# Patient Record
Sex: Male | Born: 1990 | Race: White | Hispanic: No | Marital: Single | Smoking: Current every day smoker
Health system: Southern US, Community
[De-identification: ages and names within clinical notes are randomized; demographics above are authoritative.]

## PROBLEM LIST (undated history)

## (undated) DIAGNOSIS — J45909 Unspecified asthma, uncomplicated: Secondary | ICD-10-CM

## (undated) DIAGNOSIS — L0390 Cellulitis, unspecified: Secondary | ICD-10-CM

## (undated) DIAGNOSIS — D649 Anemia, unspecified: Secondary | ICD-10-CM

## (undated) DIAGNOSIS — K219 Gastro-esophageal reflux disease without esophagitis: Secondary | ICD-10-CM

## (undated) DIAGNOSIS — F909 Attention-deficit hyperactivity disorder, unspecified type: Secondary | ICD-10-CM

## (undated) DIAGNOSIS — Z720 Tobacco use: Secondary | ICD-10-CM

## (undated) DIAGNOSIS — D696 Thrombocytopenia, unspecified: Secondary | ICD-10-CM

---
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
## 2011-09-25 ENCOUNTER — Emergency Department (HOSPITAL_COMMUNITY): Payer: No Typology Code available for payment source

## 2011-09-25 ENCOUNTER — Emergency Department (HOSPITAL_COMMUNITY)
Admission: EM | Admit: 2011-09-25 | Discharge: 2011-09-26 | Disposition: A | Discharge status: Home / Self Care | Payer: No Typology Code available for payment source | Attending: Emergency Medicine | Admitting: Emergency Medicine

## 2011-09-25 ENCOUNTER — Encounter (HOSPITAL_COMMUNITY): Payer: Self-pay | Admitting: *Deleted

## 2011-09-25 ENCOUNTER — Encounter (HOSPITAL_COMMUNITY): Payer: Self-pay | Admitting: Emergency Medicine

## 2011-09-25 ENCOUNTER — Emergency Department (HOSPITAL_COMMUNITY)
Admission: EM | Admit: 2011-09-25 | Discharge: 2011-09-25 | Disposition: A | Discharge status: Home / Self Care | Payer: No Typology Code available for payment source | Attending: Emergency Medicine | Admitting: Emergency Medicine

## 2011-09-25 DIAGNOSIS — F909 Attention-deficit hyperactivity disorder, unspecified type: Secondary | ICD-10-CM | POA: Insufficient documentation

## 2011-09-25 DIAGNOSIS — IMO0002 Reserved for concepts with insufficient information to code with codable children: Secondary | ICD-10-CM | POA: Insufficient documentation

## 2011-09-25 DIAGNOSIS — S0100XA Unspecified open wound of scalp, initial encounter: Secondary | ICD-10-CM | POA: Insufficient documentation

## 2011-09-25 DIAGNOSIS — M25519 Pain in unspecified shoulder: Secondary | ICD-10-CM | POA: Insufficient documentation

## 2011-09-25 DIAGNOSIS — T07XXXA Unspecified multiple injuries, initial encounter: Secondary | ICD-10-CM | POA: Insufficient documentation

## 2011-09-25 DIAGNOSIS — M79609 Pain in unspecified limb: Secondary | ICD-10-CM | POA: Insufficient documentation

## 2011-09-25 DIAGNOSIS — M546 Pain in thoracic spine: Secondary | ICD-10-CM | POA: Insufficient documentation

## 2011-09-25 DIAGNOSIS — T148XXA Other injury of unspecified body region, initial encounter: Secondary | ICD-10-CM | POA: Insufficient documentation

## 2011-09-25 DIAGNOSIS — R300 Dysuria: Secondary | ICD-10-CM | POA: Insufficient documentation

## 2011-09-25 DIAGNOSIS — R51 Headache: Secondary | ICD-10-CM | POA: Insufficient documentation

## 2011-09-25 DIAGNOSIS — M545 Low back pain, unspecified: Secondary | ICD-10-CM | POA: Insufficient documentation

## 2011-09-25 DIAGNOSIS — S0180XA Unspecified open wound of other part of head, initial encounter: Secondary | ICD-10-CM | POA: Insufficient documentation

## 2011-09-25 DIAGNOSIS — Z79899 Other long term (current) drug therapy: Secondary | ICD-10-CM | POA: Insufficient documentation

## 2011-09-25 DIAGNOSIS — R079 Chest pain, unspecified: Secondary | ICD-10-CM | POA: Insufficient documentation

## 2011-09-25 DIAGNOSIS — R339 Retention of urine, unspecified: Secondary | ICD-10-CM | POA: Insufficient documentation

## 2011-09-25 DIAGNOSIS — S0101XA Laceration without foreign body of scalp, initial encounter: Secondary | ICD-10-CM

## 2011-09-25 DIAGNOSIS — R319 Hematuria, unspecified: Secondary | ICD-10-CM | POA: Insufficient documentation

## 2011-09-25 DIAGNOSIS — S46919A Strain of unspecified muscle, fascia and tendon at shoulder and upper arm level, unspecified arm, initial encounter: Secondary | ICD-10-CM

## 2011-09-25 DIAGNOSIS — D649 Anemia, unspecified: Secondary | ICD-10-CM

## 2011-09-25 DIAGNOSIS — F17200 Nicotine dependence, unspecified, uncomplicated: Secondary | ICD-10-CM | POA: Insufficient documentation

## 2011-09-25 DIAGNOSIS — D696 Thrombocytopenia, unspecified: Secondary | ICD-10-CM

## 2011-09-25 DIAGNOSIS — S0181XA Laceration without foreign body of other part of head, initial encounter: Secondary | ICD-10-CM

## 2011-09-25 DIAGNOSIS — R350 Frequency of micturition: Secondary | ICD-10-CM | POA: Insufficient documentation

## 2011-09-25 DIAGNOSIS — R3911 Hesitancy of micturition: Secondary | ICD-10-CM | POA: Insufficient documentation

## 2011-09-25 DIAGNOSIS — E860 Dehydration: Secondary | ICD-10-CM | POA: Insufficient documentation

## 2011-09-25 DIAGNOSIS — X58XXXA Exposure to other specified factors, initial encounter: Secondary | ICD-10-CM | POA: Insufficient documentation

## 2011-09-25 HISTORY — DX: Attention-deficit hyperactivity disorder, unspecified type: F90.9

## 2011-09-25 LAB — COMPREHENSIVE METABOLIC PANEL
ALT: 17 U/L (ref 0–53)
ALT: 18 U/L (ref 0–53)
AST: 28 U/L (ref 0–37)
AST: 33 U/L (ref 0–37)
Albumin: 3.5 g/dL (ref 3.5–5.2)
Albumin: 4.4 g/dL (ref 3.5–5.2)
Alkaline Phosphatase: 57 U/L (ref 39–117)
Alkaline Phosphatase: 73 U/L (ref 39–117)
BUN: 12 mg/dL (ref 6–23)
CO2: 21 mEq/L (ref 19–32)
CO2: 25 mEq/L (ref 19–32)
Calcium: 9.2 mg/dL (ref 8.4–10.5)
Chloride: 109 mEq/L (ref 96–112)
Chloride: 100 mEq/L (ref 96–112)
Creatinine, Ser: 0.86 mg/dL (ref 0.50–1.35)
Creatinine, Ser: 1.01 mg/dL (ref 0.50–1.35)
GFR calc Af Amer: >90 mL/min (ref >90)
GFR calc non Af Amer: >90 mL/min (ref >90)
GFR calc non Af Amer: >90 mL/min (ref >90)
Glucose, Bld: 94 mg/dL (ref 70–99)
Potassium: 3 mEq/L — ABNORMAL LOW (ref 3.5–5.1)
Potassium: 3.9 mEq/L (ref 3.5–5.1)
Sodium: 136 mEq/L (ref 135–145)
Total Bilirubin: 0.3 mg/dL (ref 0.3–1.2)
Total Bilirubin: 0.7 mg/dL (ref 0.3–1.2)
Total Protein: 7.2 g/dL (ref 6.0–8.3)

## 2011-09-25 LAB — URINALYSIS, MICROSCOPIC ONLY
Bilirubin Urine: NEGATIVE
Ketones, ur: 15 mg/dL — AB
Leukocytes, UA: NEGATIVE
Nitrite: NEGATIVE
Protein, ur: NEGATIVE mg/dL
Urobilinogen, UA: 0.2 mg/dL (ref 0.0–1.0)

## 2011-09-25 LAB — CBC
HCT: 24 % — ABNORMAL LOW (ref 39.0–52.0)
HCT: 41.8 % (ref 39.0–52.0)
Hemoglobin: 14.5 g/dL (ref 13.0–17.0)
MCH: 32.4 pg (ref 26.0–34.0)
MCH: 32.4 pg (ref 26.0–34.0)
MCHC: 32.5 g/dL (ref 30.0–36.0)
MCHC: 34.6 g/dL (ref 30.0–36.0)
MCHC: 34.7 g/dL (ref 30.0–36.0)
MCV: 99.6 fL (ref 78.0–100.0)
MCV: 93.8 fL (ref 78.0–100.0)
MCV: 93.5 fL (ref 78.0–100.0)
Platelets: 59 10*3/uL — ABNORMAL LOW (ref 150–400)
Platelets: 62 10*3/uL — ABNORMAL LOW (ref 150–400)
Platelets: 116 10*3/uL — ABNORMAL LOW (ref 150–400)
RBC: 2.5 MIL/uL — ABNORMAL LOW (ref 4.22–5.81)
RBC: 4.47 MIL/uL (ref 4.22–5.81)
RDW: 13.7 % (ref 11.5–15.5)
RDW: 12.7 % (ref 11.5–15.5)
RDW: 12.6 % (ref 11.5–15.5)
WBC: 8 10*3/uL (ref 4.0–10.5)
WBC: 10.2 10*3/uL (ref 4.0–10.5)

## 2011-09-25 LAB — DIFFERENTIAL
Basophils Absolute: 0 10*3/uL (ref 0.0–0.1)
Basophils Absolute: 0 10*3/uL (ref 0.0–0.1)
Basophils Relative: 0 % (ref 0–1)
Eosinophils Absolute: 0 10*3/uL (ref 0.0–0.7)
Eosinophils Absolute: 0.1 10*3/uL (ref 0.0–0.7)
Eosinophils Relative: 1 % (ref 0–5)
Lymphocytes Relative: 19 % (ref 12–46)
Lymphs Abs: 0.6 10*3/uL — ABNORMAL LOW (ref 0.7–4.0)
Lymphs Abs: 2 10*3/uL (ref 0.7–4.0)
Monocytes Absolute: 1.3 10*3/uL — ABNORMAL HIGH (ref 0.1–1.0)
Monocytes Relative: 13 % — ABNORMAL HIGH (ref 3–12)
Neutro Abs: 6.8 10*3/uL (ref 1.7–7.7)
Neutro Abs: 6.9 10*3/uL (ref 1.7–7.7)
Neutrophils Relative %: 67 % (ref 43–77)

## 2011-09-25 LAB — URINALYSIS, ROUTINE W REFLEX MICROSCOPIC
Bilirubin Urine: NEGATIVE
Ketones, ur: 15 mg/dL — AB
Nitrite: NEGATIVE
Urobilinogen, UA: 0.2 mg/dL (ref 0.0–1.0)
pH: 6.5 (ref 5.0–8.0)

## 2011-09-25 LAB — POCT I-STAT, CHEM 8
BUN: 8 mg/dL (ref 6–23)
Calcium, Ion: 0.89 mmol/L — ABNORMAL LOW (ref 1.12–1.32)
Creatinine, Ser: 1 mg/dL (ref 0.50–1.35)
Hemoglobin: 12.2 g/dL — ABNORMAL LOW (ref 13.0–17.0)
Sodium: 141 mEq/L (ref 135–145)
TCO2: 18 mmol/L (ref 0–100)

## 2011-09-25 LAB — PROTIME-INR
INR: 1.09 (ref 0.00–1.49)
Prothrombin Time: 14.3 seconds (ref 11.6–15.2)

## 2011-09-25 LAB — LACTIC ACID, PLASMA: Lactic Acid, Venous: 1.8 mmol/L (ref 0.5–2.2)

## 2011-09-25 LAB — URINE MICROSCOPIC-ADD ON

## 2011-09-25 LAB — SAMPLE TO BLOOD BANK

## 2011-09-25 MED ORDER — ONDANSETRON HCL 4 MG/2ML IJ SOLN: 4.0000 mg | Freq: Once | INTRAMUSCULAR | Status: DC

## 2011-09-25 MED ORDER — IOHEXOL 300 MG/ML  SOLN
100.0000 mL | Freq: Once | INTRAMUSCULAR | Status: AC | PRN
  Administered 2011-09-25: 100 mL via INTRAVENOUS

## 2011-09-25 MED ORDER — DIAZEPAM 5 MG PO TABS: 5.0000 mg | ORAL_TABLET | Freq: Three times a day (TID) | ORAL | Status: DC | PRN

## 2011-09-25 MED ORDER — ONDANSETRON 4 MG PO TBDP
8.0000 mg | ORAL_TABLET | Freq: Once | ORAL | Status: AC
  Administered 2011-09-25: 8 mg via ORAL
  Filled 2011-09-25: qty 2

## 2011-09-25 MED ORDER — FENTANYL CITRATE 0.05 MG/ML IJ SOLN
50.0000 ug | INTRAMUSCULAR | Status: DC | PRN
  Administered 2011-09-25 (×3): 50 ug via INTRAVENOUS
  Filled 2011-09-25 (×3): qty 2

## 2011-09-25 MED ORDER — CEPHALEXIN 500 MG PO CAPS: 500.0000 mg | ORAL_CAPSULE | Freq: Four times a day (QID) | ORAL | Status: DC

## 2011-09-25 MED ORDER — PHENAZOPYRIDINE HCL 200 MG PO TABS: 200.0000 mg | ORAL_TABLET | Freq: Three times a day (TID) | ORAL | Status: DC

## 2011-09-25 MED ORDER — TETANUS-DIPHTH-ACELL PERTUSSIS 5-2.5-18.5 LF-MCG/0.5 IM SUSP
0.5000 mL | Freq: Once | INTRAMUSCULAR | Status: AC
  Administered 2011-09-25: 0.5 mL via INTRAMUSCULAR
  Filled 2011-09-25: qty 0.5

## 2011-09-25 MED ORDER — LIDOCAINE HCL 2 % EX GEL: Freq: Once | CUTANEOUS | Status: DC

## 2011-09-25 MED ORDER — ONDANSETRON HCL 4 MG/2ML IJ SOLN
INTRAMUSCULAR | Status: AC
  Administered 2011-09-25: 4 mg via INTRAVENOUS
  Filled 2011-09-25: qty 2

## 2011-09-25 MED ORDER — PHENAZOPYRIDINE HCL 100 MG PO TABS
200.0000 mg | ORAL_TABLET | Freq: Once | ORAL | Status: AC
  Administered 2011-09-25: 200 mg via ORAL
  Filled 2011-09-25: qty 2

## 2011-09-25 MED ORDER — OXYCODONE-ACETAMINOPHEN 5-325 MG PO TABS
2.0000 | ORAL_TABLET | Freq: Once | ORAL | Status: AC
  Administered 2011-09-25: 2 via ORAL
  Filled 2011-09-25: qty 2

## 2011-09-25 MED ORDER — CEFAZOLIN SODIUM 1-5 GM-% IV SOLN
1.0000 g | Freq: Once | INTRAVENOUS | Status: AC
  Administered 2011-09-25: 1 g via INTRAVENOUS
  Filled 2011-09-25: qty 50

## 2011-09-25 MED ORDER — OXYCODONE-ACETAMINOPHEN 5-325 MG PO TABS: 2.0000 | ORAL_TABLET | ORAL | Status: DC | PRN

## 2011-09-25 MED ORDER — ONDANSETRON HCL 4 MG/2ML IJ SOLN
4.0000 mg | Freq: Once | INTRAMUSCULAR | Status: AC
  Administered 2011-09-25: 4 mg via INTRAVENOUS
  Filled 2011-09-25: qty 2

## 2011-09-25 MED ORDER — DIAZEPAM 5 MG PO TABS
5.0000 mg | ORAL_TABLET | Freq: Once | ORAL | Status: AC
  Administered 2011-09-25: 5 mg via ORAL
  Filled 2011-09-25: qty 1

## 2011-09-25 MED ORDER — HYDROMORPHONE HCL PF 1 MG/ML IJ SOLN
1.0000 mg | Freq: Once | INTRAMUSCULAR | Status: AC
  Administered 2011-09-25: 1 mg via INTRAMUSCULAR
  Filled 2011-09-25: qty 1
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
## 2011-09-25 NOTE — Progress Notes (Signed)
Orthopedic Tech Progress Note Patient Details:  Mathew Bailey 31-Jul-1990 [id number]  Other Ortho Devices Type of Ortho Device: Other (comment) Ortho Device Interventions: Application   Tareek Sabo T 09/25/2011, 8:36 AM
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
## 2011-09-25 NOTE — ED Notes (Signed)
Patient currently resting quietly in bed; no respiratory or acute distress noted.  Patient updated on plan of care; informed patient that we are currently waiting on further orders from FNP.  Patient has no other questions or concerns at this time; will continue to monitor.

## 2011-09-25 NOTE — ED Notes (Signed)
Potassium of 2.7, reported to Dr Norlene Campbell.

## 2011-09-25 NOTE — ED Notes (Signed)
Pain in jaw sutures in his scalp.  He has bloody urine headache

## 2011-09-25 NOTE — ED Notes (Signed)
Parents: Stasia Cavalier and Jermery Caratachea, [phone].  Patient's parents contacted per patient request.  Parents enroute to ED.

## 2011-09-25 NOTE — ED Notes (Signed)
Patient currently resting quietly in bed; no respiratory or acute distress noted.  Patient updated on plan of care; informed patient that we are currently waiting on urine results to come back.  Patient has no other questions or concerns at this time; will continue to monitor.

## 2011-09-25 NOTE — ED Notes (Signed)
FNP at bedside with bladder scanner.

## 2011-09-25 NOTE — ED Notes (Signed)
Patient with nausea.  MD aware.  New orders per Dr Norlene Campbell.

## 2011-09-25 NOTE — ED Notes (Signed)
MD at bedside. 

## 2011-09-25 NOTE — ED Notes (Signed)
Patient given discharge paperwork; went over discharge instructions with patient.  Patient instructed to follow up with Dr. Kevan Ny and planned next week, to follow up with the urologist, to continue taking previous prescriptions, and to take pyridium as directed.  Patient instructed to return to the ED for new, worsening, or concerning symptoms.

## 2011-09-25 NOTE — ED Notes (Addendum)
Family at in room 6 awaiting patient return from radiology.

## 2011-09-25 NOTE — ED Notes (Signed)
Family at bedside. 

## 2011-09-25 NOTE — ED Notes (Signed)
Dr.Otter aware of critical chem 8
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
## 2011-09-25 NOTE — ED Notes (Signed)
Patient ambulated well to the bathroom and back to room with no distress.

## 2011-09-25 NOTE — Discharge Instructions (Signed)
Please read over the instructions below. You were seen tonight for your painful and difficulty urinating since your catheterization last night. Your urine shows you do have blood in your urine and that you remain dehydrated. There is no infection. We did not place the catheter as you were able to empty your bladder here normally. If your symptoms return you will need to return to have the catheter placed. Continue the antibiotic and medication for pain as directed. Tajke the Pyridium as directed for painful urination. It is very important that you drink lots of liquids (preferrable water) over the next 24-48 hours. Follow up with Dr Kevan Ny as planned this week. You also need to arrange follow up with the urologist to assure that the hematuria has resolved. Return if symptoms worsen. Otherwise follow up as discussed.  Dehydration, Adult Dehydration means your body does not have as much fluid as it needs. Your kidneys, brain, and heart will not work properly without the right amount of fluids and salt.  HOME CARE  Ask your doctor how to replace body fluid losses (rehydrate).   Drink enough fluids to keep your pee (urine) clear or pale yellow.   Drink small amounts of fluids often if you feel sick to your stomach (nauseous) or throw up (vomit).   Eat like you normally do.   Avoid:   Foods or drinks high in sugar.   Bubbly (carbonated) drinks.   Juice.   Very hot or cold fluids.   Drinks with caffeine.   Fatty, greasy foods.   Alcohol.   Tobacco.   Eating too much.   Gelatin desserts.   Wash your hands to avoid spreading germs (bacteria, viruses).   Only take medicine as told by your doctor.   Keep all doctor visits as told.  GET HELP RIGHT AWAY IF:   You cannot drink something without throwing up.   You get worse even with treatment.   Your vomit has blood in it or looks greenish.   Your poop (stool) has blood in it or looks black and tarry.   You have not peed in 6 to  8 hours.   You pee a small amount of very dark pee.   You have a fever.   You pass out (faint).   You have belly (abdominal) pain that gets worse or stays in one spot (localizes).   You have a rash, stiff neck, or bad headache.   You get easily annoyed, sleepy, or are hard to wake up.   You feel weak, dizzy, or very thirsty.  MAKE SURE YOU:   Understand these instructions.   Will watch your condition.   Will get help right away if you are not doing well or get worse.  Document Released: 03/13/2009 Document Revised: 05/06/2011 Document Reviewed: 01/04/2011 [facility] Patient Information 2012 [place], [place].Hematuria, Adult Hematuria (blood in your urine) can be caused by a bladder infection (cystitis), kidney infection (pyelonephritis), prostate infection (prostatitis), or kidney stone. Infections will usually respond to antibiotics (medications which kill germs), and a kidney stone will usually pass through your urine without further treatment. If you were put on antibiotics, take all the medicine until gone. You may feel better in a few days, but take all of your medicine or the infection may not respond and become more difficult to treat. If antibiotics were not given, an infection did not cause the blood in the urine. A further work up to find out the reason may be needed. HOME CARE INSTRUCTIONS  Drink lots of fluid, 3 to 4 quarts a day. If you have been diagnosed with an infection, cranberry juice is especially recommended, in addition to large amounts of water.   Avoid caffeine, tea, and carbonated beverages, because they tend to irritate the bladder.   Avoid alcohol as it may irritate the prostate.   Only take over-the-counter or prescription medicines for pain, discomfort, or fever as directed by your caregiver.   If you have been diagnosed with a kidney stone follow your caregivers instructions regarding straining your urine to catch the stone.  TO PREVENT FURTHER  INFECTIONS:  Empty the bladder often. Avoid holding urine for long periods of time.   After a bowel movement, women should cleanse front to back. Use each tissue only once.   Empty the bladder before and after sexual intercourse if you are a male.   Return to your caregiver if you develop back pain, fever, nausea (feeling sick to your stomach), vomiting, or your symptoms (problems) are not better in 3 days. Return sooner if you are getting worse.  If you have been requested to return for further testing make sure to keep your appointments. If an infection is not the cause of blood in your urine, X-rays may be required. Your caregiver will discuss this with you. SEEK IMMEDIATE MEDICAL CARE IF:   You have a persistent fever over 102 F (38.9 C).   You develop severe vomiting and are unable to keep the medication down.   You develop severe back or abdominal pain despite taking your medications.   You begin passing a large amount of blood or clots in your urine.   You feel extremely weak or faint, or pass out.  MAKE SURE YOU:   Understand these instructions.   Will watch your condition.   Will get help right away if you are not doing well or get worse.  Document Released: 05/17/2005 Document Revised: 05/06/2011 Document Reviewed: 01/04/2008 [facility] Patient Information 2012 [place], [place].Muscle Strain A muscle strain, or pulled muscle, occurs when a muscle is over-stretched. A small number of muscle fibers may also be torn. This is especially common in athletes. This happens when a sudden violent force placed on a muscle pushes it past its capacity. Usually, recovery from a pulled muscle takes 1 to 2 weeks. But complete healing will take 5 to 6 weeks. There are millions of muscle fibers. Following injury, your body will usually return to normal quickly. HOME CARE INSTRUCTIONS   While awake, apply ice to the sore muscle for 15 to 20 minutes each hour for the first 2 days. Put ice in  a plastic bag and place a towel between the bag of ice and your skin.   Do not use the pulled muscle for several days. Do not use the muscle if you have pain.   You may wrap the injured area with an elastic bandage for comfort. Be careful not to bind it too tightly. This may interfere with blood circulation.   Only take over-the-counter or prescription medicines for pain, discomfort, or fever as directed by your caregiver. Do not use aspirin as this will increase bleeding (bruising) at injury site.   Warming up before exercise helps prevent muscle strains.  SEEK MEDICAL CARE IF:  There is increased pain or swelling in the affected area. MAKE SURE YOU:   Understand these instructions.   Will watch your condition.   Will get help right away if you are not doing well or get worse.  Document Released: 05/17/2005 Document Revised: 05/06/2011 Document Reviewed: 12/14/2006 [facility] Patient Information 2012 [place], [place].

## 2011-09-25 NOTE — ED Notes (Signed)
Lab called stating that lab values were very different that patient's last draw (this AM) and that it is unlikely that his values would change this much; Natalia Leatherwood, FNP notified.  FNP wants labs redrawn; labs reordered, per verbal order verified by read-back.

## 2011-09-25 NOTE — ED Notes (Signed)
FNP at bedside.

## 2011-09-26 NOTE — ED Provider Notes (Signed)
Medical screening examination/treatment/procedure(s) were performed by non-physician practitioner and as supervising physician I was immediately available for consultation/collaboration. Devoria Albe, MD, FACEP   Ward Givens, MD 09/26/11 0001

## 2011-09-27 ENCOUNTER — Encounter (HOSPITAL_BASED_OUTPATIENT_CLINIC_OR_DEPARTMENT_OTHER): Payer: Self-pay | Admitting: *Deleted

## 2011-09-27 ENCOUNTER — Emergency Department (HOSPITAL_BASED_OUTPATIENT_CLINIC_OR_DEPARTMENT_OTHER)
Admission: EM | Admit: 2011-09-27 | Discharge: 2011-09-27 | Disposition: A | Discharge status: Home / Self Care | Payer: No Typology Code available for payment source | Attending: Emergency Medicine | Admitting: Emergency Medicine

## 2011-09-27 DIAGNOSIS — F17200 Nicotine dependence, unspecified, uncomplicated: Secondary | ICD-10-CM | POA: Insufficient documentation

## 2011-09-27 DIAGNOSIS — R51 Headache: Secondary | ICD-10-CM | POA: Insufficient documentation

## 2011-09-27 DIAGNOSIS — M79643 Pain in unspecified hand: Secondary | ICD-10-CM

## 2011-09-27 DIAGNOSIS — R079 Chest pain, unspecified: Secondary | ICD-10-CM | POA: Insufficient documentation

## 2011-09-27 DIAGNOSIS — M79609 Pain in unspecified limb: Secondary | ICD-10-CM | POA: Insufficient documentation

## 2011-09-27 MED ORDER — HYDROCODONE-ACETAMINOPHEN 7.5-500 MG PO TABS: 1.0000 | ORAL_TABLET | Freq: Four times a day (QID) | ORAL | Status: AC | PRN

## 2011-09-27 MED ORDER — DIAZEPAM 5 MG PO TABS: 5.0000 mg | ORAL_TABLET | Freq: Two times a day (BID) | ORAL | Status: AC

## 2011-09-27 MED ORDER — KETOROLAC TROMETHAMINE 30 MG/ML IJ SOLN
30.0000 mg | Freq: Once | INTRAMUSCULAR | Status: AC
  Administered 2011-09-27: 30 mg via INTRAMUSCULAR
  Filled 2011-09-27 (×2): qty 1

## 2011-09-27 MED ORDER — MORPHINE SULFATE 4 MG/ML IJ SOLN
4.0000 mg | Freq: Once | INTRAMUSCULAR | Status: AC
  Administered 2011-09-27: 4 mg via INTRAMUSCULAR
  Filled 2011-09-27 (×2): qty 1

## 2011-09-27 NOTE — ED Notes (Signed)
Chart reviewed, care assumed
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
## 2012-11-07 ENCOUNTER — Encounter (HOSPITAL_COMMUNITY): Payer: Self-pay

## 2012-11-07 ENCOUNTER — Emergency Department (HOSPITAL_COMMUNITY)
Admission: EM | Admit: 2012-11-07 | Discharge: 2012-11-08 | Disposition: A | Discharge status: Home / Self Care | Payer: PRIVATE HEALTH INSURANCE | Attending: Emergency Medicine | Admitting: Emergency Medicine

## 2012-11-07 ENCOUNTER — Emergency Department (HOSPITAL_COMMUNITY): Payer: PRIVATE HEALTH INSURANCE

## 2012-11-07 DIAGNOSIS — R110 Nausea: Secondary | ICD-10-CM | POA: Insufficient documentation

## 2012-11-07 DIAGNOSIS — K5900 Constipation, unspecified: Secondary | ICD-10-CM | POA: Insufficient documentation

## 2012-11-07 DIAGNOSIS — Z862 Personal history of diseases of the blood and blood-forming organs and certain disorders involving the immune mechanism: Secondary | ICD-10-CM | POA: Insufficient documentation

## 2012-11-07 DIAGNOSIS — K219 Gastro-esophageal reflux disease without esophagitis: Secondary | ICD-10-CM | POA: Insufficient documentation

## 2012-11-07 DIAGNOSIS — R109 Unspecified abdominal pain: Secondary | ICD-10-CM

## 2012-11-07 DIAGNOSIS — R197 Diarrhea, unspecified: Secondary | ICD-10-CM | POA: Insufficient documentation

## 2012-11-07 DIAGNOSIS — F17200 Nicotine dependence, unspecified, uncomplicated: Secondary | ICD-10-CM | POA: Insufficient documentation

## 2012-11-07 DIAGNOSIS — R634 Abnormal weight loss: Secondary | ICD-10-CM | POA: Insufficient documentation

## 2012-11-07 DIAGNOSIS — Z8659 Personal history of other mental and behavioral disorders: Secondary | ICD-10-CM | POA: Insufficient documentation

## 2012-11-07 DIAGNOSIS — R1013 Epigastric pain: Secondary | ICD-10-CM | POA: Insufficient documentation

## 2012-11-07 HISTORY — DX: Anemia, unspecified: D64.9

## 2012-11-07 NOTE — ED Notes (Signed)
My stomach is bothering me, I can't eat any food without being nauseated per pt. I can't eat a full meal, burping up stuff per pt. Having a lot of acid reflux. I have lost a lot of weight in the past 3 weeks. Patient states that he was weighing 150, now weights 135.

## 2012-11-07 NOTE — ED Provider Notes (Signed)
History  This chart was scribed for [employer]. Colon Branch, MD by Greggory Stallion, ED Scribe. This patient was seen in room APA18/[facility]18 and the patient's care was started at 11:18 PM.  CSN: [id number]  Arrival date & time 11/07/12  2144    Chief Complaint  Patient presents with  . Abdominal Pain  . Nausea  . Gastrophageal Reflux    The history is provided by the patient. No language interpreter was used.    HPI Comments: Mathew Bailey is a 22 y.o. male with h/o anemia who presents to the Emergency Department complaining of gradually worsening, constant epigastric abdominal pain with associated constipation. Pt states he can't keep food down. He states he is only having diarrhea, no solid stool. He states he has lost about 15 pounds in 3 weeks. Pt denies fever, hematochezia, neck pain, sore throat, visual disturbance, CP, cough, SOB, urinary symptoms, back pain, HA, weakness, numbness and rash as associated symptoms.    Past Medical History  Diagnosis Date  . ADHD (attention deficit hyperactivity disorder)   . Anemia     History reviewed. No pertinent past surgical history.  No family history on file.  History  Substance Use Topics  . Smoking status: Current Every Day Smoker  . Smokeless tobacco: Not on file  . Alcohol Use: Yes      Review of Systems  A complete 10 system review of systems was obtained and all systems are negative except as noted in the HPI and PMH.   Allergies  Review of patient's allergies indicates no known allergies.  Home Medications  No current outpatient prescriptions on file.  BP 132/64  Pulse 83  Temp(Src) 97.8 F (36.6 C) (Oral)  Resp 16  Ht 6' (1.829 m)  Wt 135 lb 5 oz (61.377 kg)  BMI 18.35 kg/m2  SpO2 98%  Physical Exam  Nursing note and vitals reviewed. Constitutional: He is oriented to person, place, and time. He appears well-developed and well-nourished.  HENT:  Head: Normocephalic and atraumatic.  Eyes: Pupils are equal,  round, and reactive to light.  Neck: Normal range of motion. Neck supple.  Cardiovascular: Normal rate and regular rhythm.   Pulmonary/Chest: Effort normal and breath sounds normal. He has no wheezes. He has no rales.  Abdominal: Soft. Bowel sounds are normal.  Mild epigastric pain.  Musculoskeletal: Normal range of motion.  Neurological: He is alert and oriented to person, place, and time.  Skin: Skin is warm and dry.    ED Course  Procedures (including critical care time) Results for orders placed during the hospital encounter of 11/07/12  CBC WITH DIFFERENTIAL      Result Value Range   WBC 7.9  4.0 - 10.5 K/uL   RBC 4.37  4.22 - 5.81 MIL/uL   Hemoglobin 13.9  13.0 - 17.0 g/dL   HCT 59.5  63.8 - 75.6 %   MCV 90.2  78.0 - 100.0 fL   MCH 31.8  26.0 - 34.0 pg   MCHC 35.3  30.0 - 36.0 g/dL   RDW 43.3  29.5 - 18.8 %   Platelets    150 - 400 K/uL   Value: PLATELET CLUMPS NOTED ON SMEAR, COUNT APPEARS DECREASED   Neutrophils Relative % 46  43 - 77 %   Lymphocytes Relative 40  12 - 46 %   Monocytes Relative 9  3 - 12 %   Eosinophils Relative 4  0 - 5 %   Basophils Relative 1  0 -  1 %   Neutro Abs 3.6  1.7 - 7.7 K/uL   Lymphs Abs 3.2  0.7 - 4.0 K/uL   Monocytes Absolute 0.7  0.1 - 1.0 K/uL   Eosinophils Absolute 0.3  0.0 - 0.7 K/uL   Basophils Absolute 0.1  0.0 - 0.1 K/uL   WBC Morphology ATYPICAL LYMPHOCYTES     Smear Review       Value: PLATELET CLUMPS NOTED ON SMEAR, COUNT APPEARS DECREASED  URINALYSIS, ROUTINE W REFLEX MICROSCOPIC      Result Value Range   Color, Urine YELLOW  YELLOW   APPearance CLEAR  CLEAR   Specific Gravity, Urine >1.030 (*) 1.005 - 1.030   pH 6.0  5.0 - 8.0   Glucose, UA NEGATIVE  NEGATIVE mg/dL   Hgb urine dipstick NEGATIVE  NEGATIVE   Bilirubin Urine SMALL (*) NEGATIVE   Ketones, ur TRACE (*) NEGATIVE mg/dL   Protein, ur NEGATIVE  NEGATIVE mg/dL   Urobilinogen, UA 0.2  0.0 - 1.0 mg/dL   Nitrite NEGATIVE  NEGATIVE   Leukocytes, UA NEGATIVE   NEGATIVE  Dg Abd Acute W/chest  11/08/2012   *RADIOLOGY REPORT*  Clinical Data: Epigastric abdominal pain, nausea, and constipation for 2 months.  Weight loss of 15 pounds over 3 weeks.  ACUTE ABDOMEN SERIES (ABDOMEN 2 VIEW & CHEST 1 VIEW)  Comparison: CT abdomen and pelvis 09/25/2011.  Chest 09/25/2011.  Findings: Normal heart size and pulmonary vascularity.  Mild hyperinflation.  No focal airspace consolidation in the lungs.  No blunting of costophrenic angles.  No pneumothorax.  Mediastinal contours appear intact.  Scattered gas and stool in the colon.  A few gas filled nondistended small bowel loops are present.  No small or large bowel distension.  No free intra-abdominal air.  No abnormal air fluid levels.  No radiopaque stones.  Visualized bones appear intact.  IMPRESSION: No evidence of active pulmonary disease.  Nonobstructive bowel gas pattern.   Original Report Authenticated By: Burman Nieves, M.D.   DIAGNOSTIC STUDIES: Oxygen Saturation is 98% on RA, normal by my interpretation.    COORDINATION OF CARE: 11:36 PM-Discussed treatment plan with pt at bedside and pt agreed to plan.      MDM  Patient with abdominal pain, recent weight loss and inability to eat without abdominal pain. Labs are unremarkable, xray is negative. Reviewed results with patient. Referral to GI made. Pt stable in ED with no significant deterioration in condition.The patient appears reasonably screened and/or stabilized for discharge and I doubt any other medical condition or other [facility] requiring further screening, evaluation, or treatment in the ED at this time prior to discharge.  I personally performed the services described in this documentation, which was scribed in my presence. The recorded information has been reviewed and considered.  MDM Reviewed: nursing note and vitals Interpretation: labs and x-ray         Nicoletta Dress. Colon Branch, MD 11/08/12 [phone]

## 2012-11-08 ENCOUNTER — Encounter: Payer: Self-pay | Admitting: Gastroenterology

## 2012-11-08 LAB — CBC WITH DIFFERENTIAL/PLATELET
Basophils Relative: 1 % (ref 0–1)
Eosinophils Absolute: 0.3 10*3/uL (ref 0.0–0.7)
Eosinophils Relative: 4 % (ref 0–5)
Hemoglobin: 13.9 g/dL (ref 13.0–17.0)
Lymphs Abs: 3.2 10*3/uL (ref 0.7–4.0)
MCH: 31.8 pg (ref 26.0–34.0)
MCHC: 35.3 g/dL (ref 30.0–36.0)
MCV: 90.2 fL (ref 78.0–100.0)
Monocytes Absolute: 0.7 10*3/uL (ref 0.1–1.0)
Neutro Abs: 3.6 10*3/uL (ref 1.7–7.7)
RBC: 4.37 MIL/uL (ref 4.22–5.81)

## 2012-11-08 LAB — BASIC METABOLIC PANEL
BUN: 14 mg/dL (ref 6–23)
CO2: 28 mEq/L (ref 19–32)
Calcium: 9.7 mg/dL (ref 8.4–10.5)
Chloride: 99 mEq/L (ref 96–112)
Creatinine, Ser: 1.11 mg/dL (ref 0.50–1.35)
Glucose, Bld: 82 mg/dL (ref 70–99)

## 2012-11-08 LAB — URINALYSIS, ROUTINE W REFLEX MICROSCOPIC
Glucose, UA: NEGATIVE mg/dL
Hgb urine dipstick: NEGATIVE
Leukocytes, UA: NEGATIVE
pH: 6 (ref 5.0–8.0)

## 2012-11-08 NOTE — ED Notes (Signed)
Pt alert & oriented x4, stable gait. Patient given discharge instructions, paperwork & prescription(s). Patient  instructed to stop at the registration desk to finish any additional paperwork. Patient verbalized understanding. Pt left department w/ no further questions. 

## 2012-11-17 ENCOUNTER — Other Ambulatory Visit: Payer: Self-pay | Admitting: Gastroenterology

## 2012-11-17 DIAGNOSIS — R109 Unspecified abdominal pain: Secondary | ICD-10-CM

## 2012-11-17 DIAGNOSIS — R634 Abnormal weight loss: Secondary | ICD-10-CM

## 2012-11-20 ENCOUNTER — Ambulatory Visit: Payer: PRIVATE HEALTH INSURANCE | Admitting: Gastroenterology

## 2012-11-21 ENCOUNTER — Ambulatory Visit
Admission: RE | Admit: 2012-11-21 | Discharge: 2012-11-21 | Disposition: A | Discharge status: Home / Self Care | Payer: PRIVATE HEALTH INSURANCE | Source: Ambulatory Visit | Attending: Gastroenterology | Admitting: Gastroenterology

## 2012-11-21 ENCOUNTER — Telehealth: Payer: Self-pay | Admitting: Gastroenterology

## 2012-11-21 DIAGNOSIS — R634 Abnormal weight loss: Secondary | ICD-10-CM

## 2012-11-21 DIAGNOSIS — R109 Unspecified abdominal pain: Secondary | ICD-10-CM

## 2012-11-21 NOTE — Telephone Encounter (Signed)
Message copied by Arna Snipe on Tue Nov 21, 2012 10:28 AM ------      Message from: Donata Duff      Created: Mon Nov 20, 2012  2:10 PM       Do not bill ------

## 2012-11-29 ENCOUNTER — Ambulatory Visit
Admission: RE | Admit: 2012-11-29 | Discharge: 2012-11-29 | Disposition: A | Discharge status: Home / Self Care | Payer: PRIVATE HEALTH INSURANCE | Source: Ambulatory Visit | Attending: Gastroenterology | Admitting: Gastroenterology

## 2012-11-29 MED ORDER — IOHEXOL 300 MG/ML  SOLN
100.0000 mL | Freq: Once | INTRAMUSCULAR | Status: AC | PRN
  Administered 2012-11-29: 100 mL via INTRAVENOUS

## 2012-12-26 ENCOUNTER — Telehealth: Payer: Self-pay | Admitting: Internal Medicine

## 2012-12-26 NOTE — Telephone Encounter (Signed)
C/D 12/26/12 for appt. 01/22/13

## 2012-12-26 NOTE — Telephone Encounter (Signed)
S/W PT MOTHER IN RE NP APPT 08/25 @ 1:30 W/DR. MOHAMED REFERRING DR. Jonny Ruiz GRIFFIN DX- THROMBOCYTOPENIA WELCOME PACKET MAILED.

## 2013-01-22 ENCOUNTER — Other Ambulatory Visit: Payer: PRIVATE HEALTH INSURANCE | Admitting: Lab

## 2013-01-22 ENCOUNTER — Ambulatory Visit: Payer: PRIVATE HEALTH INSURANCE

## 2013-01-22 ENCOUNTER — Encounter: Payer: Self-pay | Admitting: Internal Medicine

## 2013-01-22 ENCOUNTER — Ambulatory Visit (HOSPITAL_BASED_OUTPATIENT_CLINIC_OR_DEPARTMENT_OTHER): Payer: PRIVATE HEALTH INSURANCE | Admitting: Internal Medicine

## 2013-01-22 ENCOUNTER — Other Ambulatory Visit (HOSPITAL_BASED_OUTPATIENT_CLINIC_OR_DEPARTMENT_OTHER): Payer: PRIVATE HEALTH INSURANCE

## 2013-01-22 ENCOUNTER — Ambulatory Visit: Payer: PRIVATE HEALTH INSURANCE | Admitting: Internal Medicine

## 2013-01-22 ENCOUNTER — Ambulatory Visit (HOSPITAL_BASED_OUTPATIENT_CLINIC_OR_DEPARTMENT_OTHER): Payer: PRIVATE HEALTH INSURANCE

## 2013-01-22 VITALS — BP 140/76 | HR 60 | Temp 98.2°F | Resp 20 | Ht 72.0 in | Wt 138.6 lb

## 2013-01-22 DIAGNOSIS — R634 Abnormal weight loss: Secondary | ICD-10-CM

## 2013-01-22 DIAGNOSIS — D696 Thrombocytopenia, unspecified: Secondary | ICD-10-CM

## 2013-01-22 LAB — COMPREHENSIVE METABOLIC PANEL (CC13)
ALT: 10 U/L (ref 0–55)
AST: 13 U/L (ref 5–34)
Albumin: 4.3 g/dL (ref 3.5–5.0)
Alkaline Phosphatase: 65 U/L (ref 40–150)
BUN: 13.2 mg/dL (ref 7.0–26.0)
CO2: 26 meq/L (ref 22–29)
Calcium: 9.9 mg/dL (ref 8.4–10.4)
Chloride: 107 meq/L (ref 98–109)
Creatinine: 1 mg/dL (ref 0.7–1.3)
Glucose: 82 mg/dL (ref 70–140)
Potassium: 4.4 meq/L (ref 3.5–5.1)
Sodium: 143 meq/L (ref 136–145)
Total Bilirubin: 0.64 mg/dL (ref 0.20–1.20)
Total Protein: 7.5 g/dL (ref 6.4–8.3)

## 2013-01-22 LAB — CBC WITH DIFFERENTIAL/PLATELET
Basophils Absolute: 0.1 10*3/uL (ref 0.0–0.1)
EOS%: 4.4 % (ref 0.0–7.0)
HCT: 44.4 % (ref 38.4–49.9)
HGB: 14.8 g/dL (ref 13.0–17.1)
LYMPH%: 24.6 % (ref 14.0–49.0)
MCH: 31.1 pg (ref 27.2–33.4)
MONO#: 0.3 10*3/uL (ref 0.1–0.9)
NEUT%: 63.6 % (ref 39.0–75.0)
Platelets: 122 10*3/uL — ABNORMAL LOW (ref 140–400)
lymph#: 1.3 10*3/uL (ref 0.9–3.3)

## 2013-01-22 NOTE — Progress Notes (Signed)
[facility] [phone]   [phone]  CONSULT NOTE  REFERRING PHYSICIAN: Dr. Kirby Funk  REASON FOR CONSULTATION:  22 years old white male with thrombocytopenia  HPI Mathew Bailey is a 22 y.o. male with no significant past medical history except GERD and abdominal pain. The patient has been complaining since November of 2013 with frequent episodes of thickness in his stomach especially after eating as well as weight loss. His pain is usually Center in location and started early in the morning associated with some nausea and occasional vomiting. He has normal bowel movements. He lost around 30 pounds over the last 6 months. The patient has a history of alcohol abuse but quit in June of 2013. He also has some shortness of breath with exertion.  He was seen by Dr. Randa Evens on 12/11/2012 and upper endoscopy performed at that time was completely normal. Biopsies for celiac disease were negative. The patient also has CT entrography of the abdomen and pelvis that showed no signs of Crohn's disease and no acute findings. Scattered foci of gas/debris within the distal small bowel along with small volume pelvic ascites. While nonspecific, the  appearance and clinical history suggests an infectious/inflammatory enteritis. During his evaluation he was found to have few platelets count and he was referred to me today for further evaluation of his condition. The patient is a current smoker and he smoked marijuana in the past.  He denied having any significant complaints today. Dr. Randa Evens kindly referred the patient to me today for further evaluation and recommendation regarding his thrombocytopenia. He is known low platelets for more than a year. The patient has normal white blood count as well as normal hemoglobin and hematocrit. CBC on 09/25/2014 showed platelets count to 116,000. Repeat CBC on 11/07/2012 showed platelets clumps. CBC on 12/11/2012 showed platelets  count of 102,000. The patient denied having any bleeding issues, bruises or ecchymosis and no history of petechiae. @SFHPI @  Past Medical History  Diagnosis Date  . ADHD (attention deficit hyperactivity disorder)   . Anemia     History reviewed. No pertinent past surgical history.  History reviewed. No pertinent family history.  Social History History  Substance Use Topics  . Smoking status: Current Every Day Smoker  . Smokeless tobacco: Not on file  . Alcohol Use: Yes    No Known Allergies  Current Outpatient Prescriptions  Medication Sig Dispense Refill  . famotidine (PEPCID) 10 MG tablet Take 10 mg by mouth 2 (two) times daily.      . Probiotic Product (PHILLIPS COLON HEALTH PO) Take by mouth daily.       No current facility-administered medications for this visit.    Review of Systems  A comprehensive review of systems was negative.  Physical Exam  AVW:UJWJX, healthy, no distress, well nourished and well developed SKIN: skin color, texture, turgor are normal HEAD: Normocephalic, No masses, lesions, tenderness or abnormalities EYES: normal, PERRLA EARS: External ears normal OROPHARYNX:no exudate and no erythema  NECK: supple, no adenopathy LYMPH:  no palpable lymphadenopathy, no hepatosplenomegaly LUNGS: clear to auscultation  HEART: regular rate & rhythm and no murmurs ABDOMEN:abdomen soft, non-tender, normal bowel sounds and no masses or organomegaly BACK: Back symmetric, no curvature. EXTREMITIES:no joint deformities, effusion, or inflammation  NEURO: alert & oriented x 3 with fluent speech, no focal motor/sensory deficits  PERFORMANCE STATUS: ECOG 1  LABORATORY DATA: Lab Results  Component Value Date   WBC 5.3 01/22/2013   HGB 14.8 01/22/2013  HCT 44.4 01/22/2013   MCV 93.4 01/22/2013   PLT 122 Large platelets present* 01/22/2013      Chemistry      Component Value Date/Time   NA 143 01/22/2013 1614   NA 138 11/07/2012 2345   K 4.4 01/22/2013 1614     K 3.7 11/07/2012 2345   CL 99 11/07/2012 2345   CO2 26 01/22/2013 1614   CO2 28 11/07/2012 2345   BUN 13.2 01/22/2013 1614   BUN 14 11/07/2012 2345   CREATININE 1.0 01/22/2013 1614   CREATININE 1.11 11/07/2012 2345      Component Value Date/Time   CALCIUM 9.9 01/22/2013 1614   CALCIUM 9.7 11/07/2012 2345   ALKPHOS 65 01/22/2013 1614   ALKPHOS 73 09/25/2011 2123   AST 13 01/22/2013 1614   AST 33 09/25/2011 2123   ALT 10 01/22/2013 1614   ALT 18 09/25/2011 2123   BILITOT 0.64 01/22/2013 1614   BILITOT 0.7 09/25/2011 2123       RADIOGRAPHIC STUDIES: No results found.  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 His platelets count today whether 122,000 with a large platelets. My concern that the patient may have an immune mediated condition. I ordered several studies today to evaluate his condition including repeat CBC, comprehensive metabolic panel, LDH in addition to a TSH, acute hepatitis panel, HIV and ANA. I will see the patient back for followup visit in 2 weeks for evaluation and discussion of the pending lab results. The patient is feeling fine today and no need for intervention regarding treatment unless his platelets count are less than 50,000 or the patient is actively bleeding.  His persistent weight loss has no known etiology. He is followed by gastroenterology I would consider her for colonoscopy in the future to rule out Crohn's disease or ulcerative colitis. He will discuss this with Dr. Randa Evens in his upcoming visit. I gave the patient and his family the time to ask questions and I answered them completely to their satisfaction.  The patient voices understanding of current disease status and treatment options and is in agreement with the current care  plan.  All questions were answered. The patient knows to call the clinic with any problems, questions or concerns. We can certainly see the patient much sooner if necessary.  Thank you so much for allowing me to participate in the care of [name]. I will continue to follow up the patient with you and assist in his care.  I spent 35 minutes counseling the patient face to face. The total time spent in the appointment was 50 minutes.  Lambert Jeanty K. 01/22/2013, 6:48 PM

## 2013-01-22 NOTE — Patient Instructions (Signed)
Followup visit in 2 weeks. 

## 2013-01-22 NOTE — Progress Notes (Signed)
Checked in new pt with no financial concerns. °

## 2013-01-24 ENCOUNTER — Telehealth: Payer: Self-pay | Admitting: Internal Medicine

## 2013-01-24 ENCOUNTER — Other Ambulatory Visit: Payer: PRIVATE HEALTH INSURANCE | Admitting: Lab

## 2013-01-24 NOTE — Telephone Encounter (Signed)
lmonvm for pt re appt for lb today 8/27 and f/u 9/12. Pt asked to call me directly re new lb appt d/t if he cannot come in today. Schedule mailed.

## 2013-02-06 ENCOUNTER — Ambulatory Visit: Payer: PRIVATE HEALTH INSURANCE | Admitting: Internal Medicine

## 2013-02-09 ENCOUNTER — Telehealth: Payer: Self-pay | Admitting: Medical Oncology

## 2013-02-09 ENCOUNTER — Ambulatory Visit: Payer: PRIVATE HEALTH INSURANCE | Admitting: Internal Medicine

## 2013-02-09 NOTE — Telephone Encounter (Signed)
I left message for pt to call about missed appt.

## 2014-02-12 ENCOUNTER — Encounter (HOSPITAL_COMMUNITY): Payer: Self-pay | Admitting: Emergency Medicine

## 2014-02-12 ENCOUNTER — Inpatient Hospital Stay (HOSPITAL_COMMUNITY)
Admission: EM | Admit: 2014-02-12 | Discharge: 2014-02-14 | DRG: 603 | Disposition: A | Discharge status: Home / Self Care | Payer: PRIVATE HEALTH INSURANCE | Attending: Internal Medicine | Admitting: Internal Medicine

## 2014-02-12 ENCOUNTER — Emergency Department (HOSPITAL_COMMUNITY): Payer: PRIVATE HEALTH INSURANCE

## 2014-02-12 DIAGNOSIS — F909 Attention-deficit hyperactivity disorder, unspecified type: Secondary | ICD-10-CM | POA: Diagnosis present

## 2014-02-12 DIAGNOSIS — L03119 Cellulitis of unspecified part of limb: Principal | ICD-10-CM

## 2014-02-12 DIAGNOSIS — L0390 Cellulitis, unspecified: Secondary | ICD-10-CM | POA: Diagnosis present

## 2014-02-12 DIAGNOSIS — F17200 Nicotine dependence, unspecified, uncomplicated: Secondary | ICD-10-CM | POA: Diagnosis present

## 2014-02-12 DIAGNOSIS — T148XXA Other injury of unspecified body region, initial encounter: Secondary | ICD-10-CM

## 2014-02-12 DIAGNOSIS — Z720 Tobacco use: Secondary | ICD-10-CM | POA: Diagnosis present

## 2014-02-12 DIAGNOSIS — L02619 Cutaneous abscess of unspecified foot: Secondary | ICD-10-CM | POA: Diagnosis not present

## 2014-02-12 DIAGNOSIS — M79609 Pain in unspecified limb: Secondary | ICD-10-CM | POA: Diagnosis not present

## 2014-02-12 DIAGNOSIS — L03115 Cellulitis of right lower limb: Secondary | ICD-10-CM | POA: Diagnosis present

## 2014-02-12 DIAGNOSIS — S91309A Unspecified open wound, unspecified foot, initial encounter: Secondary | ICD-10-CM | POA: Diagnosis present

## 2014-02-12 DIAGNOSIS — D696 Thrombocytopenia, unspecified: Secondary | ICD-10-CM | POA: Diagnosis present

## 2014-02-12 DIAGNOSIS — W268XXA Contact with other sharp object(s), not elsewhere classified, initial encounter: Secondary | ICD-10-CM | POA: Diagnosis present

## 2014-02-12 HISTORY — DX: Tobacco use: Z72.0

## 2014-02-12 HISTORY — DX: Gastro-esophageal reflux disease without esophagitis: K21.9

## 2014-02-12 HISTORY — DX: Thrombocytopenia, unspecified: D69.6

## 2014-02-12 HISTORY — DX: Cellulitis, unspecified: L03.90

## 2014-02-12 MED ORDER — OXYCODONE-ACETAMINOPHEN 5-325 MG PO TABS
1.0000 | ORAL_TABLET | Freq: Once | ORAL | Status: AC
Start: 2014-02-12 — End: 2014-02-12
  Administered 2014-02-12: 1 via ORAL
  Filled 2014-02-12: qty 1

## 2014-02-12 NOTE — ED Notes (Signed)
Patients right foot soaking in betadine and Sterile saline per order

## 2014-02-12 NOTE — ED Provider Notes (Signed)
CSN: [id number]     Arrival date & time 02/12/14  2129 History   First MD Initiated Contact with Patient 02/12/14 2236     Chief Complaint  Patient presents with  . Foot Injury     (Consider location/radiation/quality/duration/timing/severity/associated sxs/prior Treatment) Patient is a 23 y.o. male presenting with foot injury. The history is provided by the patient.  Foot Injury Location:  Foot  Mathew Bailey is a 23 y.o. male who presents to the ED with right foot pain. 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 The nail went through his tennis shoe and into his foot. He continued to work and tonight when he took off his sock he noted the puncture to the bottom of his foot and redness to the top of his foot at the base of his toes. He complains of increasing pain.   Past Medical History  Diagnosis Date  . ADHD (attention deficit hyperactivity disorder)   . Anemia    History reviewed. No pertinent past surgical history. History reviewed. No pertinent family history. History  Substance Use Topics  . Smoking status: Current Every Day Smoker  . Smokeless tobacco: Never Used  . Alcohol Use: Yes    Review of Systems Negative except as stated in HPI   Allergies  Review of patient's allergies indicates no known allergies.  Home Medications   Prior to Admission medications   Medication Sig Start Date End Date Taking? Authorizing Provider  famotidine (PEPCID) 10 MG tablet Take 10 mg by mouth 2 (two) times daily.    Historical Provider, MD  Probiotic Product (PHILLIPS COLON HEALTH PO) Take by mouth daily.    Historical Provider, MD   BP 153/83  Pulse 73  Temp(Src) 97.8 F (36.6 C) (Oral)  Resp 20  Ht  (1.803 m)  Wt 150 lb (68.04 kg)  BMI 20.93 kg/m2  SpO2 98% Physical Exam  Nursing note and vitals reviewed. Constitutional: He is oriented to person, place, and time. He appears well-developed and well-nourished.  HENT:  Head:  Normocephalic.  Eyes: EOM are normal.  Neck: Neck supple.  Cardiovascular: Normal rate.   Pulmonary/Chest: Effort normal.  Musculoskeletal: Normal range of motion.       Right foot: He exhibits tenderness. He exhibits no deformity.       Feet:  There is a puncture wound to the plantar aspect of the right foot at the base of the middle toe and erythema noted to the dorsum of the foot.   Neurological: He is alert and oriented to person, place, and time. No cranial nerve deficit.  Skin: Skin is warm and dry.  Psychiatric: He has a normal mood and affect. His behavior is normal.    ED Course  Procedures (including critical care time) X-ray, labs, soaked foot in NSS with Betadine, IV antibiotics, pain management.  Labs Review Dg Foot Complete Right  02/13/2014   CLINICAL DATA:  Stepped on rest male, puncture wound. Redness and swelling at post.  EXAM: RIGHT FOOT COMPLETE - 3+ VIEW  COMPARISON:  None.  FINDINGS: There is no evidence of fracture or dislocation. There is no evidence of arthropathy or other focal bone abnormality. Soft tissues are unremarkable, specifically no radiopaque foreign bodies or subcutaneous gas.  IMPRESSION: Negative.   Electronically Signed   By: [name]   On: 02/13/2014 00:07    Results for orders placed during the hospital encounter of 02/12/14 (from the past 24  hour(s))  CBC WITH DIFFERENTIAL     Status: Abnormal   Collection Time    02/13/14 12:13 AM      Result Value Ref Range   WBC 9.8  4.0 - 10.5 K/uL   RBC 4.27  4.22 - 5.81 MIL/uL   Hemoglobin 13.8  13.0 - 17.0 g/dL   HCT 16.1 (*) 09.6 - 04.5 %   MCV 90.9  78.0 - 100.0 fL   MCH 32.3  26.0 - 34.0 pg   MCHC 35.6  30.0 - 36.0 g/dL   RDW 40.9  81.1 - 91.4 %   Platelets 124 (*) 150 - 400 K/uL   Neutrophils Relative % 60  43 - 77 %   Neutro Abs 5.9  1.7 - 7.7 K/uL   Lymphocytes Relative 28  12 - 46 %   Lymphs Abs 2.7  0.7 - 4.0 K/uL   Monocytes Relative 10  3 - 12 %   Monocytes Absolute 1.0   0.1 - 1.0 K/uL   Eosinophils Relative 2  0 - 5 %   Eosinophils Absolute 0.2  0.0 - 0.7 K/uL   Basophils Relative 0  0 - 1 %   Basophils Absolute 0.0  0.0 - 0.1 K/uL  BASIC METABOLIC PANEL     Status: None   Collection Time    02/13/14 12:13 AM      Result Value Ref Range   Sodium 141  137 - 147 mEq/L   Potassium 4.2  3.7 - 5.3 mEq/L   Chloride 103  96 - 112 mEq/L   CO2 26  19 - 32 mEq/L   Glucose, Bld 87  70 - 99 mg/dL   BUN 13  6 - 23 mg/dL   Creatinine, Ser 7.82  0.50 - 1.35 mg/dL   Calcium 9.5  8.4 - 95.6 mg/dL   GFR calc non Af Amer >90  >90 mL/min   GFR calc Af Amer >90  >90 mL/min   Anion gap 12  5 - 15    MDM  23 y.o. male with right foot pain, swelling and erythema s/p injury after stepping on a nail while at work. Consult for admission. IV Clindamycin 600 mg and pain medication.      [facility] [name], [place] 02/13/14 [phone]

## 2014-02-12 NOTE — ED Notes (Signed)
Stepped on nail with right foot.

## 2014-02-13 DIAGNOSIS — D696 Thrombocytopenia, unspecified: Secondary | ICD-10-CM | POA: Diagnosis present

## 2014-02-13 DIAGNOSIS — T148XXA Other injury of unspecified body region, initial encounter: Secondary | ICD-10-CM | POA: Diagnosis present

## 2014-02-13 DIAGNOSIS — L03119 Cellulitis of unspecified part of limb: Secondary | ICD-10-CM | POA: Diagnosis present

## 2014-02-13 DIAGNOSIS — F909 Attention-deficit hyperactivity disorder, unspecified type: Secondary | ICD-10-CM | POA: Diagnosis present

## 2014-02-13 DIAGNOSIS — M79609 Pain in unspecified limb: Secondary | ICD-10-CM | POA: Diagnosis present

## 2014-02-13 DIAGNOSIS — L0390 Cellulitis, unspecified: Secondary | ICD-10-CM | POA: Diagnosis present

## 2014-02-13 DIAGNOSIS — S91309A Unspecified open wound, unspecified foot, initial encounter: Secondary | ICD-10-CM | POA: Diagnosis present

## 2014-02-13 DIAGNOSIS — L02619 Cutaneous abscess of unspecified foot: Secondary | ICD-10-CM | POA: Diagnosis present

## 2014-02-13 DIAGNOSIS — Z720 Tobacco use: Secondary | ICD-10-CM | POA: Diagnosis present

## 2014-02-13 DIAGNOSIS — F17200 Nicotine dependence, unspecified, uncomplicated: Secondary | ICD-10-CM | POA: Diagnosis present

## 2014-02-13 DIAGNOSIS — L03115 Cellulitis of right lower limb: Secondary | ICD-10-CM | POA: Diagnosis present

## 2014-02-13 DIAGNOSIS — W268XXA Contact with other sharp object(s), not elsewhere classified, initial encounter: Secondary | ICD-10-CM | POA: Diagnosis present

## 2014-02-13 LAB — CBC WITH DIFFERENTIAL/PLATELET
BASOS ABS: 0 10*3/uL (ref 0.0–0.1)
BASOS PCT: 0 % (ref 0–1)
Basophils Absolute: 0 10*3/uL (ref 0.0–0.1)
Basophils Relative: 0 % (ref 0–1)
EOS ABS: 0.2 10*3/uL (ref 0.0–0.7)
EOS ABS: 0.2 10*3/uL (ref 0.0–0.7)
EOS PCT: 2 % (ref 0–5)
Eosinophils Relative: 3 % (ref 0–5)
HCT: 37.6 % — ABNORMAL LOW (ref 39.0–52.0)
HEMATOCRIT: 38.8 % — AB (ref 39.0–52.0)
Hemoglobin: 13 g/dL (ref 13.0–17.0)
Hemoglobin: 13.8 g/dL (ref 13.0–17.0)
LYMPHS ABS: 2.5 10*3/uL (ref 0.7–4.0)
LYMPHS PCT: 34 % (ref 12–46)
Lymphocytes Relative: 28 % (ref 12–46)
Lymphs Abs: 2.7 10*3/uL (ref 0.7–4.0)
MCH: 32.1 pg (ref 26.0–34.0)
MCH: 32.3 pg (ref 26.0–34.0)
MCHC: 34.6 g/dL (ref 30.0–36.0)
MCHC: 35.6 g/dL (ref 30.0–36.0)
MCV: 90.9 fL (ref 78.0–100.0)
MCV: 92.8 fL (ref 78.0–100.0)
MONO ABS: 1 10*3/uL (ref 0.1–1.0)
Monocytes Absolute: 0.8 10*3/uL (ref 0.1–1.0)
Monocytes Relative: 10 % (ref 3–12)
Monocytes Relative: 10 % (ref 3–12)
NEUTROS PCT: 53 % (ref 43–77)
Neutro Abs: 4 10*3/uL (ref 1.7–7.7)
Neutro Abs: 5.9 10*3/uL (ref 1.7–7.7)
Neutrophils Relative %: 60 % (ref 43–77)
PLATELETS: 124 10*3/uL — AB (ref 150–400)
Platelets: 111 10*3/uL — ABNORMAL LOW (ref 150–400)
RBC: 4.05 MIL/uL — AB (ref 4.22–5.81)
RBC: 4.27 MIL/uL (ref 4.22–5.81)
RDW: 12.1 % (ref 11.5–15.5)
RDW: 12.4 % (ref 11.5–15.5)
Smear Review: DECREASED
WBC: 7.6 10*3/uL (ref 4.0–10.5)
WBC: 9.8 10*3/uL (ref 4.0–10.5)

## 2014-02-13 LAB — COMPREHENSIVE METABOLIC PANEL
ALT: 9 U/L (ref 0–53)
ANION GAP: 12 (ref 5–15)
AST: 16 U/L (ref 0–37)
Albumin: 3.8 g/dL (ref 3.5–5.2)
Alkaline Phosphatase: 60 U/L (ref 39–117)
BUN: 12 mg/dL (ref 6–23)
CALCIUM: 8.8 mg/dL (ref 8.4–10.5)
CO2: 24 mEq/L (ref 19–32)
Chloride: 105 mEq/L (ref 96–112)
Creatinine, Ser: 0.87 mg/dL (ref 0.50–1.35)
GFR calc non Af Amer: >90 mL/min (ref >90)
Glucose, Bld: 87 mg/dL (ref 70–99)
Potassium: 4.1 mEq/L (ref 3.7–5.3)
Sodium: 141 mEq/L (ref 137–147)
Total Bilirubin: 0.7 mg/dL (ref 0.3–1.2)
Total Protein: 6.6 g/dL (ref 6.0–8.3)

## 2014-02-13 LAB — BASIC METABOLIC PANEL
ANION GAP: 12 (ref 5–15)
BUN: 13 mg/dL (ref 6–23)
CALCIUM: 9.5 mg/dL (ref 8.4–10.5)
CO2: 26 mEq/L (ref 19–32)
CREATININE: 0.98 mg/dL (ref 0.50–1.35)
Chloride: 103 mEq/L (ref 96–112)
GFR calc Af Amer: >90 mL/min (ref >90)
Glucose, Bld: 87 mg/dL (ref 70–99)
Potassium: 4.2 mEq/L (ref 3.7–5.3)
SODIUM: 141 meq/L (ref 137–147)

## 2014-02-13 LAB — TSH: TSH: 2.1 u[IU]/mL (ref 0.350–4.500)

## 2014-02-13 LAB — VITAMIN B12: Vitamin B-12: 352 pg/mL (ref 211–911)

## 2014-02-13 MED ORDER — CLINDAMYCIN PHOSPHATE 900 MG/50ML IV SOLN
900.0000 mg | Freq: Three times a day (TID) | INTRAVENOUS | Status: DC
  Filled 2014-02-13: qty 50

## 2014-02-13 MED ORDER — CIPROFLOXACIN HCL 250 MG PO TABS: 750.0000 mg | ORAL_TABLET | Freq: Once | ORAL | Status: DC

## 2014-02-13 MED ORDER — SODIUM CHLORIDE 0.9 % IJ SOLN
3.0000 mL | Freq: Two times a day (BID) | INTRAMUSCULAR | Status: DC
  Administered 2014-02-13 – 2014-02-14 (×3): 3 mL via INTRAVENOUS

## 2014-02-13 MED ORDER — CLINDAMYCIN PHOSPHATE 900 MG/50ML IV SOLN
900.0000 mg | Freq: Three times a day (TID) | INTRAVENOUS | Status: DC
  Administered 2014-02-13 – 2014-02-14 (×4): 900 mg via INTRAVENOUS
  Filled 2014-02-13 (×4): qty 50

## 2014-02-13 MED ORDER — SODIUM CHLORIDE 0.9 % IJ SOLN: 3.0000 mL | INTRAMUSCULAR | Status: DC | PRN

## 2014-02-13 MED ORDER — HYDROCODONE-ACETAMINOPHEN 5-325 MG PO TABS
1.0000 | ORAL_TABLET | ORAL | Status: DC | PRN
  Administered 2014-02-13 – 2014-02-14 (×5): 2 via ORAL
  Filled 2014-02-13 (×5): qty 2

## 2014-02-13 MED ORDER — CLINDAMYCIN PHOSPHATE 600 MG/50ML IV SOLN
600.0000 mg | Freq: Once | INTRAVENOUS | Status: AC
Start: 2014-02-13 — End: 2014-02-13
  Administered 2014-02-13: 600 mg via INTRAVENOUS
  Filled 2014-02-13: qty 50

## 2014-02-13 MED ORDER — SODIUM CHLORIDE 0.9 % IV SOLN: 250.0000 mL | INTRAVENOUS | Status: DC | PRN

## 2014-02-13 MED ORDER — HEPARIN SODIUM (PORCINE) 5000 UNIT/ML IJ SOLN: 5000.0000 [IU] | Freq: Three times a day (TID) | INTRAMUSCULAR | Status: DC

## 2014-02-13 MED ORDER — HEPARIN SODIUM (PORCINE) 5000 UNIT/ML IJ SOLN
5000.0000 [IU] | Freq: Three times a day (TID) | INTRAMUSCULAR | Status: DC
  Administered 2014-02-13 – 2014-02-14 (×4): 5000 [IU] via SUBCUTANEOUS
  Filled 2014-02-13 (×4): qty 1

## 2014-02-13 MED ORDER — ONDANSETRON HCL 4 MG/2ML IJ SOLN
4.0000 mg | Freq: Four times a day (QID) | INTRAMUSCULAR | Status: DC | PRN
  Administered 2014-02-13: 4 mg via INTRAVENOUS
  Filled 2014-02-13: qty 2

## 2014-02-13 MED ORDER — OXYCODONE-ACETAMINOPHEN 5-325 MG PO TABS
1.0000 | ORAL_TABLET | Freq: Three times a day (TID) | ORAL | Status: DC | PRN
  Administered 2014-02-13 – 2014-02-14 (×2): 2 via ORAL
  Filled 2014-02-13 (×2): qty 2

## 2014-02-13 NOTE — Progress Notes (Signed)
The patient was seen and examined. His chart, vitals signs, laboratory studies were reviewed. He was discussed with nurse practitioner, Ms. Vedia Coffer. Agree with her assessment, findings, and plan.

## 2014-02-13 NOTE — Progress Notes (Signed)
Patient seen and examined. Reports good pain management and mild nausea. No fever chills.  Hx thrombocytopenia seen by Dr. Jerolyn Center last year. Patient did not complete work up. Denies unusual bruising/bleeding.    PE gen well nourished appears comfortable Chest HS RRR No MGR  Respiratory: normal effort BS clear bilaterally no wheeze MS: right foot with very small puncture wound plantar area, no drainage. Right foot with swelling and erythema extending to dorsal foot at base of 2nd and 3rd toe. Very tender to touch   Plan: Continue antibiotics and pain medicine. Await TSH and B12.      Toya Smothers NP

## 2014-02-13 NOTE — H&P (Signed)
Hospitalist Admission History and Physical  Patient name: Mathew Bailey Medical record number: [id number] Date of birth: 02-Sep-1990 Age: 23 y.o. Gender: male  Primary Care Provider: No PCP Per Patient  Chief Complaint: cellulitis, plantar puncture wound   History of Present Illness:This is a 23 y.o. year old male with significant past medical history of tobacco abuse  presenting with R foot cellulitis and R foot plantar puncture wound. 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 States that he accidentally stepped on a nail while at work. States that he had progressive R foot pain and swelling 5-6 hours later. Pain progressively worsened over the course of the day. Believes that puncture was at base of 2nd/3rd toe.  Presented to the ER because of sxs. Afebrile. Hemodynamically stable. WBC 9.8. Hgb 13.8. R foot xray WNL. Was started on IV clindamycin. Reports improvement in swelling since medication. Still with mild pain. States that he has had a tetanus shot within last 2 years. EDP asking for admission.   Assessment and Plan: Mathew Bailey is a 23 y.o. year old male presenting with cellulitis   Active Problems:   Cellulitis   1-Cellulitis  -cont IV clindamycin -trace erythema on exam  -continue to follow closely  -repeat CBC in am   FEN/GI: regular diet  Prophylaxis: sub q heparin  Disposition: pending further evaluation  Code Status:Full Code    Patient Active Problem List   Diagnosis Date Noted  . Cellulitis 02/13/2014  . Thrombocytopenia, unspecified 01/22/2013   Past Medical History: Past Medical History  Diagnosis Date  . ADHD (attention deficit hyperactivity disorder)   . Anemia     Past Surgical History: History reviewed. No pertinent past surgical history.  Social History: History   Social History  . Marital Status: Single    Spouse Name: N/A    Number of Children: N/A  . Years of Education: N/A   Social History Main Topics  . Smoking status: Current  Every Day Smoker  . Smokeless tobacco: Never Used  . Alcohol Use: Yes  . Drug Use: No  . Sexual Activity: None   Other Topics Concern  . None   Social History Narrative  . None    Family History: History reviewed. No pertinent family history.  Allergies: No Known Allergies  Current Facility-Administered Medications  Medication Dose Route Frequency Provider Last Rate Last Dose  . 0.9 %  sodium chloride infusion  250 mL Intravenous PRN Doree Albee, MD      . clindamycin (CLEOCIN) IVPB 900 mg  900 mg Intravenous 3 times per day Doree Albee, MD      . heparin injection 5,000 Units  5,000 Units Subcutaneous 3 times per day Doree Albee, MD      . HYDROcodone-acetaminophen (NORCO/VICODIN) 5-325 MG per tablet 1-2 tablet  1-2 tablet Oral Q4H PRN Doree Albee, MD      . sodium chloride 0.9 % injection 3 mL  3 mL Intravenous Q12H Doree Albee, MD      . sodium chloride 0.9 % injection 3 mL  3 mL Intravenous PRN Doree Albee, MD       Current Outpatient Prescriptions  Medication Sig Dispense Refill  . famotidine (PEPCID) 10 MG tablet Take 10 mg by mouth 2 (two) times daily.      . Probiotic Product (PHILLIPS COLON HEALTH PO) Take by mouth daily.       Review Of Systems: 12 point ROS negative except as noted above in HPI.  Physical Exam: Filed Vitals:  02/13/14 0103  BP: 131/88  Pulse: 82  Temp:   Resp: 20    General: alert and cooperative HEENT: PERRLA and extra ocular movement intact Heart: S1, S2 normal, no murmur, rub or gallop, regular rate and rhythm Lungs: clear to auscultation, no wheezes or rales and unlabored breathing Abdomen: abdomen is soft without significant tenderness, masses, organomegaly or guarding Extremities: R plantar foot swelling and mild TTP at base of 2nd/3rd toe  Skin:no rashes Neurology: normal without focal findings  Labs and Imaging: Lab Results  Component Value Date/Time   NA 141 02/13/2014 12:13 AM   NA 143 01/22/2013  4:14 PM   K  4.2 02/13/2014 12:13 AM   K 4.4 01/22/2013  4:14 PM   CL 103 02/13/2014 12:13 AM   CO2 26 02/13/2014 12:13 AM   CO2 26 01/22/2013  4:14 PM   BUN 13 02/13/2014 12:13 AM   BUN 13.2 01/22/2013  4:14 PM   CREATININE 0.98 02/13/2014 12:13 AM   CREATININE 1.0 01/22/2013  4:14 PM   GLUCOSE 87 02/13/2014 12:13 AM   GLUCOSE 82 01/22/2013  4:14 PM   Lab Results  Component Value Date   WBC 9.8 02/13/2014   HGB 13.8 02/13/2014   HCT 38.8* 02/13/2014   MCV 90.9 02/13/2014   PLT 124* 02/13/2014    Dg Foot Complete Right  02/13/2014   CLINICAL DATA:  Stepped on rest male, puncture wound. Redness and swelling at post.  EXAM: RIGHT FOOT COMPLETE - 3+ VIEW  COMPARISON:  None.  FINDINGS: There is no evidence of fracture or dislocation. There is no evidence of arthropathy or other focal bone abnormality. Soft tissues are unremarkable, specifically no radiopaque foreign bodies or subcutaneous gas.  IMPRESSION: Negative.   Electronically Signed   By: [name]   On: 02/13/2014 00:07           Doree Albee MD  Pager: [phone]

## 2014-02-13 NOTE — Progress Notes (Signed)
Utilization Review Completed.Julie Paolini T9/16/2015  

## 2014-02-14 ENCOUNTER — Encounter (HOSPITAL_COMMUNITY): Payer: Self-pay | Admitting: Internal Medicine

## 2014-02-14 DIAGNOSIS — D696 Thrombocytopenia, unspecified: Secondary | ICD-10-CM

## 2014-02-14 DIAGNOSIS — L02619 Cutaneous abscess of unspecified foot: Principal | ICD-10-CM

## 2014-02-14 DIAGNOSIS — F17200 Nicotine dependence, unspecified, uncomplicated: Secondary | ICD-10-CM

## 2014-02-14 DIAGNOSIS — L03119 Cellulitis of unspecified part of limb: Principal | ICD-10-CM

## 2014-02-14 DIAGNOSIS — T148XXA Other injury of unspecified body region, initial encounter: Secondary | ICD-10-CM

## 2014-02-14 LAB — CBC WITH DIFFERENTIAL/PLATELET
Basophils Absolute: 0 10*3/uL (ref 0.0–0.1)
Basophils Relative: 1 % (ref 0–1)
Eosinophils Absolute: 0.2 10*3/uL (ref 0.0–0.7)
Eosinophils Relative: 4 % (ref 0–5)
HCT: 36.8 % — ABNORMAL LOW (ref 39.0–52.0)
Hemoglobin: 12.9 g/dL — ABNORMAL LOW (ref 13.0–17.0)
LYMPHS ABS: 2.8 10*3/uL (ref 0.7–4.0)
LYMPHS PCT: 44 % (ref 12–46)
MCH: 32.2 pg (ref 26.0–34.0)
MCHC: 35.1 g/dL (ref 30.0–36.0)
MCV: 91.8 fL (ref 78.0–100.0)
MONOS PCT: 9 % (ref 3–12)
Monocytes Absolute: 0.5 10*3/uL (ref 0.1–1.0)
NEUTROS ABS: 2.7 10*3/uL (ref 1.7–7.7)
NEUTROS PCT: 43 % (ref 43–77)
PLATELETS: 114 10*3/uL — AB (ref 150–400)
RBC: 4.01 MIL/uL — AB (ref 4.22–5.81)
RDW: 12.6 % (ref 11.5–15.5)
WBC: 6.3 10*3/uL (ref 4.0–10.5)

## 2014-02-14 LAB — COMPREHENSIVE METABOLIC PANEL
ALT: 8 U/L (ref 0–53)
AST: 13 U/L (ref 0–37)
Albumin: 3.7 g/dL (ref 3.5–5.2)
Alkaline Phosphatase: 62 U/L (ref 39–117)
Anion gap: 11 (ref 5–15)
BUN: 13 mg/dL (ref 6–23)
CALCIUM: 8.8 mg/dL (ref 8.4–10.5)
CO2: 26 mEq/L (ref 19–32)
Chloride: 103 mEq/L (ref 96–112)
Creatinine, Ser: 1 mg/dL (ref 0.50–1.35)
GLUCOSE: 114 mg/dL — AB (ref 70–99)
Potassium: 4.1 mEq/L (ref 3.7–5.3)
SODIUM: 140 meq/L (ref 137–147)
TOTAL PROTEIN: 6.5 g/dL (ref 6.0–8.3)
Total Bilirubin: 0.5 mg/dL (ref 0.3–1.2)

## 2014-02-14 LAB — HIV ANTIBODY (ROUTINE TESTING W REFLEX): HIV: NONREACTIVE

## 2014-02-14 MED ORDER — CLINDAMYCIN HCL 300 MG PO CAPS: 600.0000 mg | ORAL_CAPSULE | Freq: Two times a day (BID) | ORAL | Status: DC

## 2014-02-14 MED ORDER — CLINDAMYCIN HCL 150 MG PO CAPS
600.0000 mg | ORAL_CAPSULE | Freq: Two times a day (BID) | ORAL | Status: DC
  Administered 2014-02-14: 600 mg via ORAL
  Filled 2014-02-14 (×2): qty 4

## 2014-02-14 MED ORDER — OXYCODONE-ACETAMINOPHEN 5-325 MG PO TABS: 1.0000 | ORAL_TABLET | Freq: Three times a day (TID) | ORAL | Status: DC | PRN

## 2014-02-14 NOTE — Discharge Summary (Signed)
Physician Discharge Summary  MORRELL FLUKE WUJ:[id number] DOB: Jul 22, 1990 DOA: 02/12/2014  PCP: No PCP Per Patient  Admit date: 02/12/2014 Discharge date: 02/14/2014  Time spent: 40 minutes  Recommendations for Outpatient Follow-up:  1. Patient to follow up with family PCP in 1-2 weeks to ensure resolution of cellulitis 2. Follow up with Dr Arbutus Ped or family hematologist to finish workup on thrombocytopenia started last year  Discharge Diagnoses:  Principal Problem:   Cellulitis of foot, right Active Problems:   Thrombocytopenia, unspecified   R Plantar Puncture wound   Tobacco abuse   Cellulitis   Discharge Condition: stable  Diet recommendation: regular  Filed Weights   02/12/14 2134 02/13/14 0300  Weight: 68.04 kg (150 lb) 64.501 kg (142 lb 3.2 oz)    History of present illness:  This is a 23 y.o. year old male with significant past medical history of tobacco abuse and thrombocytopenia presented on 02/13/14 with R foot cellulitis and R foot plantar puncture wound. 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 Stated that he accidentally stepped on a nail while at work. Stated that he had progressive R foot pain and swelling 5-6 hours later. Pain progressively worsened over the course of the day. Believed that puncture was at base of 2nd/3rd toe.  Presented to the ER because of sxs. Afebrile. Hemodynamically stable. WBC 9.8. Hgb 13.8. R foot xray WNL. Was started on IV clindamycin. Reports improvement in swelling since medication. States that he has had a tetanus shot within last 2 years.     Hospital Course:  Cellulitis of right foot. Provided with IV clindamycin. Swelling/erythema and pain quickly improved. At discharge swelling almost completely resolved and erythema much improved. Bearing weight with little trouble. He remained afebrile and non-toxic appearing. Will be discharged with 6 more days of clindamycin. Instructed to follow up with PCP in 1-2 weeks to ensure resolution of  symptoms. Instructed to return to ED if symptoms fail to continue to improve, develops fever, worsening pain/swelling.  Thrombocytopenia: hx of same for almost 2 years. HIV antibody non-reactive, TSH within limits of normal, Vitamin B12 within limits of normal,   Chart review reveals office visit note last year Dr Arbutus Ped. Planned lab work and follow up appointment but patient did not follow up. Educated to importance of follow up. He reports he will follow up with his father's hematologist in [place] or Dr Arbutus Ped in 1-2 weeks.  Tobacco use: cessation counseling offered.   Right plantar puncture would. Very small. Decreased swelling and pain at discharge. No drainage. See #1.   Procedures:  none  Consultations:  none  Discharge Exam: Filed Vitals:   02/14/14 0626  BP: 124/72  Pulse: 57  Temp: 98.1 F (36.7 C)  Resp: 20    General: well nourished appears comfortable  Cardiovascular: S1 and S2. RRR No MGR No LE edeam Respiratory: normal effort BS clear bilaterally no wheeze MS: right foot with healing, small puncture wound plantar area. No drainage. Much less swelling and erythema. 2nd and 3rd toes with less swelling and increased ROM  Discharge Instructions You were cared for by a hospitalist during your hospital stay. If you have any questions about your discharge medications or the care you received while you were in the hospital after you are discharged, you can call the unit and asked to speak with the hospitalist on call if the hospitalist that took care of you is not available. Once you are discharged, your primary care physician will handle any further medical issues.  Please note that NO REFILLS for any discharge medications will be authorized once you are discharged, as it is imperative that you return to your primary care physician (or establish a relationship with a primary care physician if you do not have one) for your aftercare needs so that they can reassess your need  for medications and monitor your lab values.   Current Discharge Medication List    START taking these medications   Details  clindamycin (CLEOCIN) 300 MG capsule Take 2 capsules (600 mg total) by mouth 2 (two) times daily. Qty: 24 capsule, Refills: 0    oxyCODONE-acetaminophen (PERCOCET/ROXICET) 5-325 MG per tablet Take 1 tablet by mouth every 8 (eight) hours as needed for severe pain (refractory to hydrocodone). Qty: 15 tablet, Refills: 0      CONTINUE these medications which have NOT CHANGED   Details  diphenhydrAMINE (BENADRYL) 25 mg capsule Take 25 mg by mouth at bedtime as needed for allergies.       No Known Allergies Follow-up Information   Follow up with patient to see parent's PCP in 7-10 days for evaluation of cellulitis.      Follow up with Lajuana Matte., MD. Schedule an appointment as soon as possible for a visit in 2 weeks. (patient reports father has hemotologist that he will see but if not will follow up with Dr Arbutus Ped as instructed)    Specialty:  Oncology   Contact information:   [address] [place] [address] [phone]        The results of significant diagnostics from this hospitalization (including imaging, microbiology, ancillary and laboratory) are listed below for reference.    Significant Diagnostic Studies: Dg Foot Complete Right  14-Mar-2014   CLINICAL DATA:  Stepped on rest male, puncture wound. Redness and swelling at post.  EXAM: RIGHT FOOT COMPLETE - 3+ VIEW  COMPARISON:  None.  FINDINGS: There is no evidence of fracture or dislocation. There is no evidence of arthropathy or other focal bone abnormality. Soft tissues are unremarkable, specifically no radiopaque foreign bodies or subcutaneous gas.  IMPRESSION: Negative.   Electronically Signed   By: [name]   On: Mar 14, 2014 00:07    Microbiology: No results found for this or any previous visit (from the past 240 hour(s)).   Labs: Basic Metabolic Panel:  Recent  Labs Lab Mar 14, 2014 0013 March 14, 2014 0506 02/14/14 0512  NA 141 141 140  K 4.2 4.1 4.1  CL 103 105 103  CO2 GLUCOSE 87 87 114*  BUN CREATININE 0.98 0.87 1.00  CALCIUM 9.5 8.8 8.8   Liver Function Tests:  Recent Labs Lab 03/14/14 0506 02/14/14 0512  AST 16 13  ALT 9 8  ALKPHOS 60 62  BILITOT 0.7 0.5  PROT 6.6 6.5  ALBUMIN 3.8 3.7   No results found for this basename: LIPASE, AMYLASE,  in the last 168 hours No results found for this basename: AMMONIA,  in the last 168 hours CBC:  Recent Labs Lab 2014-03-14 0013 2014/03/14 0506 02/14/14 0512  WBC 9.8 7.6 6.3  NEUTROABS 5.9 4.0 2.7  HGB 13.8 13.0 12.9*  HCT 38.8* 37.6* 36.8*  MCV 90.9 92.8 91.8  PLT 124* 111* 114*   Cardiac Enzymes: No results found for this basename: CKTOTAL, CKMB, CKMBINDEX, TROPONINI,  in the last 168 hours BNP: BNP (last 3 results) No results found for this basename: PROBNP,  in the last 8760 hours CBG: No results found for this basename:  GLUCAP,  in the last 168 hours     Signed:  Gwenyth Bender  [facility] 02/14/2014, 10:30 AM

## 2014-02-14 NOTE — ED Provider Notes (Signed)
Medical screening examination/treatment/procedure(s) were performed by non-physician practitioner and as supervising physician I was immediately available for consultation/collaboration.   EKG Interpretation None        Virgel Haro, MD 02/14/14 0216 

## 2014-02-14 NOTE — Discharge Summary (Signed)
The patient was seen and examined. His chart, vitals signs, laboratory studies were reviewed. He was discussed with nurse practitioner, Ms. Vedia Coffer. Agree with her findings.

## 2014-02-14 NOTE — Progress Notes (Signed)
NURSING PROGRESS NOTE  Mathew Bailey [id number] Discharge Data: 02/14/2014 1:58 PM Attending Provider: No att. providers found PCP:No PCP Per Patient   [name] to be D/C'd Home per MD order.    All IV's will be discontinued and monitored for bleeding.  All belongings will be returned to patient for patient to take home.  AVS summary, prescriptions, and information on cellulitis reviewed with patient.   Patient left floor ambulating, escorted by RN.  Last Documented Vital Signs:  Blood pressure 124/72, pulse 57, temperature 98.1 F (36.7 C), temperature source Oral, resp. rate 20, height  (1.803 m), weight 64.501 kg (142 lb 3.2 oz), SpO2 99.00%.  [name] D

## 2014-09-13 ENCOUNTER — Emergency Department (HOSPITAL_COMMUNITY)
Admission: EM | Admit: 2014-09-13 | Discharge: 2014-09-13 | Disposition: A | Discharge status: Home / Self Care | Payer: PRIVATE HEALTH INSURANCE | Attending: Emergency Medicine | Admitting: Emergency Medicine

## 2014-09-13 ENCOUNTER — Encounter (HOSPITAL_COMMUNITY): Payer: Self-pay | Admitting: Emergency Medicine

## 2014-09-13 ENCOUNTER — Emergency Department (HOSPITAL_COMMUNITY): Payer: PRIVATE HEALTH INSURANCE

## 2014-09-13 DIAGNOSIS — Z792 Long term (current) use of antibiotics: Secondary | ICD-10-CM | POA: Insufficient documentation

## 2014-09-13 DIAGNOSIS — R112 Nausea with vomiting, unspecified: Secondary | ICD-10-CM | POA: Diagnosis not present

## 2014-09-13 DIAGNOSIS — R569 Unspecified convulsions: Secondary | ICD-10-CM | POA: Diagnosis present

## 2014-09-13 DIAGNOSIS — Z79899 Other long term (current) drug therapy: Secondary | ICD-10-CM | POA: Insufficient documentation

## 2014-09-13 DIAGNOSIS — Z8719 Personal history of other diseases of the digestive system: Secondary | ICD-10-CM | POA: Insufficient documentation

## 2014-09-13 DIAGNOSIS — Z862 Personal history of diseases of the blood and blood-forming organs and certain disorders involving the immune mechanism: Secondary | ICD-10-CM | POA: Diagnosis not present

## 2014-09-13 DIAGNOSIS — Z720 Tobacco use: Secondary | ICD-10-CM | POA: Insufficient documentation

## 2014-09-13 DIAGNOSIS — Z872 Personal history of diseases of the skin and subcutaneous tissue: Secondary | ICD-10-CM | POA: Diagnosis not present

## 2014-09-13 DIAGNOSIS — Z8659 Personal history of other mental and behavioral disorders: Secondary | ICD-10-CM | POA: Diagnosis not present

## 2014-09-13 LAB — SALICYLATE LEVEL: Salicylate Lvl: <4 mg/dL (ref 2.8–20.0)

## 2014-09-13 LAB — CBC
HEMATOCRIT: 42.6 % (ref 39.0–52.0)
HEMOGLOBIN: 15 g/dL (ref 13.0–17.0)
MCH: 32.5 pg (ref 26.0–34.0)
MCHC: 35.2 g/dL (ref 30.0–36.0)
MCV: 92.4 fL (ref 78.0–100.0)
Platelets: 123 10*3/uL — ABNORMAL LOW (ref 150–400)
RBC: 4.61 MIL/uL (ref 4.22–5.81)
RDW: 12.5 % (ref 11.5–15.5)
WBC: 8.4 10*3/uL (ref 4.0–10.5)

## 2014-09-13 LAB — BASIC METABOLIC PANEL
Anion gap: 9 (ref 5–15)
BUN: 15 mg/dL (ref 6–23)
CO2: 23 mmol/L (ref 19–32)
CREATININE: 0.85 mg/dL (ref 0.50–1.35)
Calcium: 9.7 mg/dL (ref 8.4–10.5)
Chloride: 107 mmol/L (ref 96–112)
GFR calc non Af Amer: >90 mL/min (ref >90)
Glucose, Bld: 99 mg/dL (ref 70–99)
Potassium: 4.1 mmol/L (ref 3.5–5.1)
Sodium: 139 mmol/L (ref 135–145)

## 2014-09-13 LAB — ETHANOL: Alcohol, Ethyl (B): <5 mg/dL (ref 0–9)

## 2014-09-13 LAB — RAPID URINE DRUG SCREEN, HOSP PERFORMED
Amphetamines: NOT DETECTED
BARBITURATES: NOT DETECTED
BENZODIAZEPINES: POSITIVE — AB
Cocaine: NOT DETECTED
Opiates: POSITIVE — AB
Tetrahydrocannabinol: POSITIVE — AB

## 2014-09-13 LAB — URINALYSIS, ROUTINE W REFLEX MICROSCOPIC
Bilirubin Urine: NEGATIVE
GLUCOSE, UA: NEGATIVE mg/dL
Hgb urine dipstick: NEGATIVE
KETONES UR: NEGATIVE mg/dL
LEUKOCYTES UA: NEGATIVE
Nitrite: NEGATIVE
Protein, ur: 30 mg/dL — AB
Specific Gravity, Urine: 1.025 (ref 1.005–1.030)
UROBILINOGEN UA: 0.2 mg/dL (ref 0.0–1.0)
pH: 6 (ref 5.0–8.0)

## 2014-09-13 LAB — MAGNESIUM: Magnesium: 2.1 mg/dL (ref 1.5–2.5)

## 2014-09-13 LAB — URINE MICROSCOPIC-ADD ON

## 2014-09-13 LAB — ACETAMINOPHEN LEVEL: Acetaminophen (Tylenol), Serum: <10 ug/mL — ABNORMAL LOW (ref 10–30)

## 2014-09-13 NOTE — Discharge Instructions (Signed)
Return for any recurrent seizures.  Limited to follow-up with the neurology for additional workup for the seizure. Would recommend avoiding any alcohol or drug substance use in the meantime.  Resource guide provided below.   Emergency Department Resource Guide 1) Find a Doctor and Pay Out of Pocket Although you won't have to find out who is covered by your insurance plan, it is a good idea to ask around and get recommendations. You will then need to call the office and see if the doctor you have chosen will accept you as a new patient and what types of options they offer for patients who are self-pay. Some doctors offer discounts or will set up payment plans for their patients who do not have insurance, but you will need to ask so you aren't surprised when you get to your appointment.  2) Contact Your Local Health Department Not all health departments have doctors that can see patients for sick visits, but many do, so it is worth a call to see if yours does. If you don't know where your local health department is, you can check in your phone book. The CDC also has a tool to help you locate your state's health department, and many state websites also have listings of all of their local health departments.  3) Find a Walk-in Clinic If your illness is not likely to be very severe or complicated, you may want to try a walk in clinic. These are popping up all over the country in pharmacies, drugstores, and shopping centers. They're usually staffed by nurse practitioners or physician assistants that have been trained to treat common illnesses and complaints. They're usually fairly quick and inexpensive. However, if you have serious medical issues or chronic medical problems, these are probably not your best option.  No Primary Care Doctor: - Call Health Connect at  [phone] - they can help you locate a primary care doctor that  accepts your insurance, provides certain services, etc. - Physician Referral  Service- [phone]  Chronic Pain Problems: Organization         Address  Phone   Notes  [facility]  [phone]) [phone] Patients need to be referred by their primary care doctor.   Medication Assistance: Organization         Address  Phone   Notes  [facility] Medication [facility] [address] [place], [place] [address] [phone]) [phone] --Must be a resident of [place] -- Must have NO insurance coverage whatsoever (no Medicaid/ Medicare, etc.) -- The pt. MUST have a primary care doctor that directs their care regularly and follows them in the community   MedAssist  [phone]) [phone]   [employer]  [phone]    Agencies that provide inexpensive medical care: Organization         Address  Phone   Notes  [facility]  [phone]) [phone]   [facility]    [phone]   [facility] [address] [place], [place] [address] [phone]) [phone]   [facility] [address], [place] [phone]) [phone]   [facility]anned [facility]    [phone]   [facility]    [phone]   [facility]  [address], [place] Phone:  [phone]) [phone], Fax:  [phone]) [phone] Hours of Operation:  9 am - 6 pm, M-F.  Also accepts Medicaid/Medicare and self-pay.  [facility]  [address], [place] Phone: [phone], Fax: [phone]. Hours of Operation:  8:30 am - 5:30 pm, M-F.  Also accepts Medicaid and self-pay.  [facility] [place] [address] [place] Phone: [phone]   [place], [place], [place] [phone], Ext. [phone] Mondays & Thursdays: 7-9 AM.  First 15 patients are seen on a first come, first serve basis.    [place] Providers:  Organization         Address  Phone   Notes  [facility] [address], [place] [phone] Also accepts self-pay patients.  [place] [facility] [id number] [place], [place]  [phone]   [place], [address], [place] [phone]   [facility] [address] [place] [phone]   Lucianne Lei [address], [place]   [phone] Only accepts [place] Access [place] patients after they have their name applied to their card.   Self-Pay (no insurance) in [facility]:  Organization         Address  Phone   Notes  Sickle Cell Patients, [facility] [place] [phone]   [facility] [place] [phone]   [facility] [place]  [place], [place], [place] [phone]   [facility]/Dr. Osei-Bonsu  [address] [place] or [address], [place] [phone] Phone number for both [place] and [place] locations is the same.  [facility] [address] [place] [phone]   [facility] [address], [place] or [address] [phone]   [facility] [address] [place] [phone], phone; [phone], fax Sees patients 1st and 3rd Saturday of every month.  Must not qualify for public or private insurance (i.e. Medicaid, Medicare, [place] Health Choice, Veterans' Benefits)  Household income should be no more than 200% of the poverty level The clinic cannot treat you if you are pregnant or think you are pregnant  Sexually transmitted diseases are not treated at the clinic.    Dental Care: Organization         Address  Phone  Notes  [facility] Department of [facility] [phone] Accepts children up to age 55 who are enrolled in  [place] or [place]; pregnant women with a Medicaid card; and children who have applied for Medicaid or [place] Health Choice, but were declined, whose parents can pay a reduced fee at time of service.  [facility] Department of [facility]  [address], [place] [phone] Accepts children up to age 34 who are enrolled in [place] or [place]; pregnant women with a Medicaid card; and children who have applied for Medicaid or [place] Health Choice, but were declined, whose parents can pay a reduced fee at time of service.  [facility] Adult [facility] Access PRO[facility]  [place] [phone] Patients are seen by appointment only. Walk-ins are not accepted. [place] will see patients 77 years of age and older. Monday - Tuesday (8am-5pm) Most Wednesdays (8:30-5pm) $30 per visit, cash only  [place] [facility]  [address], [facility] [phone] Patients are seen by appointment only. Walk-ins are not accepted. [place] will see patients 55 years of age and older. One Wednesday Evening (Monthly: Volunteer Based).  $30 per visit, cash only  [place]  [phone] for adults; Children under age 16, call Graduate Pediatric Dentistry at [phone]. Children aged 15-14, please call [phone] to request a pediatric application.  Dental services are provided in all areas of dental care including fillings, crowns and bridges, complete and partial dentures, implants, gum treatment, root canals, and extractions. Preventive care is also provided. Treatment is provided to both adults and children. Patients are selected via a lottery and there is often a waiting list.   [facility] [address] [place]  [phone] [email or web address]   [facility] [address] [place], [place] [phone], Ext. [phone] Second and Fourth Thursday of each month, opens at 6:30  AM; Clinic ends at 9 AM.  Patients are seen on a first-come first-served basis, and a limited number are seen during each clinic.   [facility]  [address] Hillard Danker [place], [place] [phone]   Eligibility Requirements You must have lived in [place], [place], or [place] counties for at least the last three months.   You cannot be eligible for state or federal sponsored [employer], including [employer], [place], or [employer].   You generally cannot be eligible for healthcare insurance through your employer.    How to apply: Eligibility screenings are held every Tuesday and Wednesday afternoon from 1:00 pm until 4:00 pm. You do not need an appointment for the interview!  [facility] [address] [place], [place]   [employer]  [place] Department  [place]  [phone]    Behavioral Health Resources in the Community: Intensive Outpatient Programs Organization         Address  Phone  Notes  [place]. [address], [place], [place] [phone]   [facility]patient [address], [place], [employer]   ADS: Alcohol & Drug Svcs [address], [place], [place]   [place] [address]  [place], [place] or [phone]   Substance Abuse Resources Organization         Address  Phone  Notes  Alcohol and Drug Services  [phone]   [place]  [phone]   The [place]   Chinita Pester  [phone]   Residential & Outpatient Substance Abuse Program  [phone]   Psychological Services Organization         Address  Phone  Notes  [facility] [place]  [place]  [phone]   [place] [address] [place] [phone] or  [phone]    Mobile Crisis Teams Organization         Address  Phone  Notes  [facility]atives, Mobile Crisis Care Unit  [phone]   [facility]  [address] [place], [place]   [place] Levels [address] [place] [phone]    Self-Help/Support Groups Organization         Address  Phone             Notes  Mental Health Assoc. of [place] - variety of support groups  [place] Call for more information  Narcotics Anonymous (NA), Caring Services [address], [employer] [place]  2 meetings at this location   Special educational needs teacher         Address  Phone  Notes  [facility] [place],    [place]  [phone]   [facility]  [address], [place] [address], [place], [place]   [place], [place] [phone] Admissions: 8am-3pm M-F  Incentives Substance [place] [address],    [place], [place] [phone]   [facility] [address] [place], [place], [place]   [facility] [address],  [place], [place]   [facility] Programs - Intensive Outpatient [address], [place], [place]   [facility] ([place].,  [place], [place] [phone] or [phone]   [facility] Services (RTS) [address] [place], [place] Accepts Medicaid  [facility] [place] [address]  [place] [phone] Substance Abuse/Addiction Treatment   [facility] Organization         Address  Phone  Notes  [facility]  [phone]   Domenic Schwab, PhD [address] Arlis Porta [place], [place]   [phone] or [phone]   [place], [place] [phone]   [facility] [address],  [place], [place] [phone] Insurance/Medicaid/sponsorship through [facility] and Families [address], [address]                                    [place], [place] [phone] [place] [address], [place] [phone]    Dr. Adele Schilder  [phone]   [facility] Dept. 1) [address] [place] 2) [place] 3)  [place] [address], [place] [phone]  [phone]   [place] [phone] or [phone] (After Hours)

## 2014-09-13 NOTE — ED Notes (Signed)
Patient transported to CT 

## 2014-09-13 NOTE — ED Notes (Signed)
Pt states that he drank a lot of alcohol last night and took a bunch of pills trying to get high.  States he has thought about hurting himself but this was not an effort to do so.  Pts mother states that she is not sure what happened as pt is stating that he was home all day.  Mother states he was not.  Girlfriend was with pt all last night and is stating that he did nothing including no drinking or pills.  States that they were not at a party.  Pt not postictal at this time and exhibits suicidal ideations.

## 2014-09-13 NOTE — ED Provider Notes (Addendum)
CSN: [id number]     Arrival date & time 09/13/14  1747 History   First MD Initiated Contact with Patient 09/13/14 1832     Chief Complaint  Patient presents with  . Seizures     (Consider location/radiation/quality/duration/timing/severity/associated sxs/prior Treatment) Patient is a 24 y.o. male presenting with seizures. The history is provided by the patient, the EMS personnel and a parent.  Seizures  patient with seizure. EMS called to the house for this. EMS witnessed seizure 2 lasting 30-45 seconds followed by vomiting. Patient admits to taking Xanax and alcohol yesterday denies anything today. Patient also admits to marijuana use. Brother told EMS that patient spoke of committing suicide today at 4:00 in the afternoon because he broke up with his girlfriend yesterday however his girlfriend is here now. Patient admits to a history of suicide attempt by overdose in the past. EMS gave 2 mg of Narcan IV without any changes. No seizure meds given. Upon arrival here patient alert and oriented.  Past Medical History  Diagnosis Date  . ADHD (attention deficit hyperactivity disorder)   . Anemia   . Thrombocytopenia   . Cellulitis   . Tobacco use   . GERD (gastroesophageal reflux disease)    History reviewed. No pertinent past surgical history. History reviewed. No pertinent family history. History  Substance Use Topics  . Smoking status: Current Every Day Smoker  . Smokeless tobacco: Never Used  . Alcohol Use: Yes    Review of Systems  Constitutional: Negative for fever.  HENT: Negative for congestion.   Eyes: Negative for visual disturbance.  Respiratory: Negative for shortness of breath.   Cardiovascular: Negative for chest pain.  Gastrointestinal: Positive for nausea and vomiting. Negative for abdominal pain.  Genitourinary: Negative for dysuria.  Musculoskeletal: Negative for back pain and neck pain.  Skin: Negative for rash.  Neurological: Positive for seizures. Negative  for speech difficulty and headaches.  Hematological: Does not bruise/bleed easily.  Psychiatric/Behavioral: Negative for suicidal ideas, hallucinations, confusion and self-injury. The patient is not nervous/anxious.       Allergies  Other  Home Medications   Prior to Admission medications   Medication Sig Start Date End Date Taking? Authorizing Provider  ibuprofen (ADVIL,MOTRIN) 200 MG tablet Take 200 mg by mouth every 6 (six) hours as needed for mild pain or moderate pain.   Yes Historical Provider, MD  clindamycin (CLEOCIN) 300 MG capsule Take 2 capsules (600 mg total) by mouth 2 (two) times daily. Patient not taking: Reported on 09/13/2014 02/14/14   Gwenyth BenderKaren M Black, NP  diphenhydrAMINE (BENADRYL) 25 mg capsule Take 25 mg by mouth at bedtime as needed for allergies.    Historical Provider, MD  oxyCODONE-acetaminophen (PERCOCET/ROXICET) 5-325 MG per tablet Take 1 tablet by mouth every 8 (eight) hours as needed for severe pain (refractory to hydrocodone). Patient not taking: Reported on 09/13/2014 02/14/14   Gwenyth BenderKaren M Black, NP   BP 138/86 mmHg  Pulse 74  Temp(Src) 98.1 F (36.7 C) (Oral)  Resp 16  Ht 6' (1.829 m)  Wt 150 lb (68.04 kg)  BMI 20.34 kg/m2  SpO2 98% Physical Exam  Constitutional: He is oriented to person, place, and time. He appears well-developed and well-nourished. No distress.  HENT:  Head: Normocephalic and atraumatic.  Mouth/Throat: Oropharynx is clear and moist.  Eyes: Conjunctivae and EOM are normal. Pupils are equal, round, and reactive to light.  Neck: Normal range of motion.  Cardiovascular: Normal rate and regular rhythm.   Pulmonary/Chest: Effort normal and  breath sounds normal.  Abdominal: Soft. Bowel sounds are normal. There is no tenderness.  Musculoskeletal: Normal range of motion. He exhibits no edema.  Neurological: He is alert and oriented to person, place, and time. No cranial nerve deficit. He exhibits normal muscle tone. Coordination normal.   Skin: Skin is warm.  Psychiatric: He has a normal mood and affect. His behavior is normal. Judgment and thought content normal.  Nursing note and vitals reviewed.   ED Course  Procedures (including critical care time) Labs Review Labs Reviewed  CBC - Abnormal; Notable for the following:    Platelets 123 (*)    All other components within normal limits  URINE RAPID DRUG SCREEN (HOSP PERFORMED) - Abnormal; Notable for the following:    Opiates POSITIVE (*)    Benzodiazepines POSITIVE (*)    Tetrahydrocannabinol POSITIVE (*)    All other components within normal limits  URINALYSIS, ROUTINE W REFLEX MICROSCOPIC - Abnormal; Notable for the following:    Protein, ur 30 (*)    All other components within normal limits  ACETAMINOPHEN LEVEL - Abnormal; Notable for the following:    Acetaminophen (Tylenol), Serum <10.0 (*)    All other components within normal limits  BASIC METABOLIC PANEL  ETHANOL  SALICYLATE LEVEL  MAGNESIUM  URINE MICROSCOPIC-ADD ON    Imaging Review Dg Chest 2 View  09/13/2014   CLINICAL DATA:  Seizure today.  EXAM: CHEST  2 VIEW  COMPARISON:  11/07/2012  FINDINGS: Mild hyperinflation again seen, stable. The cardiomediastinal contours are normal. The lungs are clear. Pulmonary vasculature is normal. No consolidation, pleural effusion, or pneumothorax. No acute osseous abnormalities are seen.  IMPRESSION: No acute pulmonary process.   Electronically Signed   By: Rubye Oaks M.D.   On: 09/13/2014 20:05   Ct Head Wo Contrast  09/13/2014   CLINICAL DATA:  New onset seizure.  EXAM: CT HEAD WITHOUT CONTRAST  TECHNIQUE: Contiguous axial images were obtained from the base of the skull through the vertex without intravenous contrast.  COMPARISON:  Head CT 09/25/2011  FINDINGS: The ventricles are normal in size and configuration. No extra-axial fluid collections are identified. The gray-white differentiation is normal. No CT findings for acute intracranial process such as  hemorrhage or infarction. No mass lesions. The brainstem and cerebellum are grossly normal.  The bony structures are intact. The paranasal sinuses and mastoid air cells are clear. The globes are intact.  IMPRESSION: No acute intracranial findings or mass lesions.   Electronically Signed   By: Rudie Meyer M.D.   On: 09/13/2014 20:01     EKG Interpretation   Date/Time:  Friday September 13 2014 18:24:24 EDT Ventricular Rate:  77 PR Interval:  127 QRS Duration: 100 QT Interval:  359 QTC Calculation: 406 R Axis:   89 Text Interpretation:  Sinus rhythm ST elev, probable normal early repol  pattern No significant change since last tracing Confirmed by Syrita Dovel   MD, Cerys Winget [phone]) on 09/13/2014 7:48:04 PM      MDM   Final diagnoses:  Seizure  Seizure    Patient with new onset seizure witnessed by EMS. Seem to be generalized. Followed by some vomiting. Patient denies multiple times that it was a suicide or homicidal thoughts. Apparently the brother told EMS patient spoke of committing suicide today. Patient denies that totally. No past history of seizures. Patient does admit to some illicit drug use. Urine drug screen was positive for benzos opiates and marijuana. No past history of any seizures. No  head injury.  Referral to neurology provided for additional workup. Referral for substance abuse problems and where to get help provided.  Workup here head CT negative chest x-ray negative labs without any significant abnormalities.  Again as stated patient adamantly denies any suicidal attempt or ideation. Patient is willing to contract for safety.    Vanetta Mulders, MD 09/13/14 1610  Vanetta Mulders, MD 09/13/14 2113

## 2014-09-13 NOTE — ED Notes (Signed)
Patient attempted to urinate in urinal, states he is unable to urinate at this time.

## 2014-09-13 NOTE — ED Notes (Signed)
EMS called to pt home for seizure. No pt history. EMS witnessed seizure x 2 lasting 30-45 seconds followed by n/heaving. Pt admits to taking po xanax and etoh yesterday. Denies taking anything today. Brother told EMS pt spoke of committing suicide today at 1600 because he broke up with girlfriend yesterday. Pt admits to h/s of suicide attempt by overdose. Pt given 2mg  narcan IV in route with no change. No seizure meds given d/t unknown ingestion.

## 2015-01-02 ENCOUNTER — Emergency Department (HOSPITAL_COMMUNITY)
Admission: EM | Admit: 2015-01-02 | Discharge: 2015-01-03 | Disposition: A | Discharge status: Home / Self Care | Payer: PRIVATE HEALTH INSURANCE | Attending: Emergency Medicine | Admitting: Emergency Medicine

## 2015-01-02 ENCOUNTER — Encounter (HOSPITAL_COMMUNITY): Payer: Self-pay | Admitting: *Deleted

## 2015-01-02 DIAGNOSIS — Z872 Personal history of diseases of the skin and subcutaneous tissue: Secondary | ICD-10-CM | POA: Insufficient documentation

## 2015-01-02 DIAGNOSIS — F1110 Opioid abuse, uncomplicated: Secondary | ICD-10-CM | POA: Diagnosis not present

## 2015-01-02 DIAGNOSIS — Z862 Personal history of diseases of the blood and blood-forming organs and certain disorders involving the immune mechanism: Secondary | ICD-10-CM | POA: Diagnosis not present

## 2015-01-02 DIAGNOSIS — Z8719 Personal history of other diseases of the digestive system: Secondary | ICD-10-CM | POA: Insufficient documentation

## 2015-01-02 DIAGNOSIS — F13120 Sedative, hypnotic or anxiolytic abuse with intoxication, uncomplicated: Secondary | ICD-10-CM | POA: Insufficient documentation

## 2015-01-02 DIAGNOSIS — F419 Anxiety disorder, unspecified: Secondary | ICD-10-CM | POA: Insufficient documentation

## 2015-01-02 DIAGNOSIS — Z008 Encounter for other general examination: Secondary | ICD-10-CM | POA: Diagnosis present

## 2015-01-02 DIAGNOSIS — F1910 Other psychoactive substance abuse, uncomplicated: Secondary | ICD-10-CM

## 2015-01-02 DIAGNOSIS — Z720 Tobacco use: Secondary | ICD-10-CM | POA: Insufficient documentation

## 2015-01-02 DIAGNOSIS — F1210 Cannabis abuse, uncomplicated: Secondary | ICD-10-CM | POA: Insufficient documentation

## 2015-01-02 LAB — COMPREHENSIVE METABOLIC PANEL
ALBUMIN: 4.4 g/dL (ref 3.5–5.0)
ALT: 15 U/L — ABNORMAL LOW (ref 17–63)
ANION GAP: 8 (ref 5–15)
AST: 24 U/L (ref 15–41)
Alkaline Phosphatase: 59 U/L (ref 38–126)
BUN: 14 mg/dL (ref 6–20)
CO2: 25 mmol/L (ref 22–32)
CREATININE: 0.94 mg/dL (ref 0.61–1.24)
Calcium: 9.1 mg/dL (ref 8.9–10.3)
Chloride: 106 mmol/L (ref 101–111)
Glucose, Bld: 97 mg/dL (ref 65–99)
Potassium: 3.9 mmol/L (ref 3.5–5.1)
Sodium: 139 mmol/L (ref 135–145)
Total Bilirubin: 0.8 mg/dL (ref 0.3–1.2)
Total Protein: 6.8 g/dL (ref 6.5–8.1)

## 2015-01-02 LAB — CBC WITH DIFFERENTIAL/PLATELET
Basophils Absolute: 0.1 10*3/uL (ref 0.0–0.1)
Basophils Relative: 1 % (ref 0–1)
Eosinophils Absolute: 0.3 10*3/uL (ref 0.0–0.7)
Eosinophils Relative: 5 % (ref 0–5)
HCT: 38.5 % — ABNORMAL LOW (ref 39.0–52.0)
HEMOGLOBIN: 13.3 g/dL (ref 13.0–17.0)
LYMPHS PCT: 38 % (ref 12–46)
Lymphs Abs: 2 10*3/uL (ref 0.7–4.0)
MCH: 31.4 pg (ref 26.0–34.0)
MCHC: 34.5 g/dL (ref 30.0–36.0)
MCV: 91 fL (ref 78.0–100.0)
MONO ABS: 0.4 10*3/uL (ref 0.1–1.0)
Monocytes Relative: 7 % (ref 3–12)
NEUTROS ABS: 2.6 10*3/uL (ref 1.7–7.7)
Neutrophils Relative %: 49 % (ref 43–77)
Platelets: 106 10*3/uL — ABNORMAL LOW (ref 150–400)
RBC: 4.23 MIL/uL (ref 4.22–5.81)
RDW: 12.5 % (ref 11.5–15.5)
WBC: 5.4 10*3/uL (ref 4.0–10.5)

## 2015-01-02 LAB — RAPID URINE DRUG SCREEN, HOSP PERFORMED
AMPHETAMINES: NOT DETECTED
Barbiturates: NOT DETECTED
Benzodiazepines: POSITIVE — AB
COCAINE: NOT DETECTED
Opiates: POSITIVE — AB
Tetrahydrocannabinol: POSITIVE — AB

## 2015-01-02 LAB — ETHANOL: Alcohol, Ethyl (B): <5 mg/dL (ref <5)

## 2015-01-02 NOTE — ED Notes (Signed)
Discharge instructions given, pt demonstrated teach back and verbal understanding. No concerns voiced.  

## 2015-01-02 NOTE — Discharge Instructions (Signed)
As discussed, it is important that you follow up as soon as possible with your physician for continued management of your condition. ° °If you develop any new, or concerning changes in your condition, please return to the emergency department immediately. ° °
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
## 2015-01-02 NOTE — ED Notes (Signed)
Pt to department by [facility] with IVC papers. Per papers, pt has been using drugs for aprox 6 months.  Paper states that pt attempted suicide about 2 months ago.  Pt states that he had a seizure, he did not attempt suicide.  Denies thought of SI/HI at present.

## 2015-07-21 ENCOUNTER — Encounter (HOSPITAL_COMMUNITY): Payer: Self-pay | Admitting: *Deleted

## 2015-07-21 ENCOUNTER — Emergency Department (HOSPITAL_COMMUNITY): Payer: PRIVATE HEALTH INSURANCE

## 2015-07-21 ENCOUNTER — Emergency Department (HOSPITAL_COMMUNITY)
Admission: EM | Admit: 2015-07-21 | Discharge: 2015-07-21 | Disposition: A | Discharge status: Home / Self Care | Payer: PRIVATE HEALTH INSURANCE | Attending: Emergency Medicine | Admitting: Emergency Medicine

## 2015-07-21 DIAGNOSIS — Z872 Personal history of diseases of the skin and subcutaneous tissue: Secondary | ICD-10-CM | POA: Insufficient documentation

## 2015-07-21 DIAGNOSIS — Y9389 Activity, other specified: Secondary | ICD-10-CM | POA: Diagnosis not present

## 2015-07-21 DIAGNOSIS — Y9289 Other specified places as the place of occurrence of the external cause: Secondary | ICD-10-CM | POA: Diagnosis not present

## 2015-07-21 DIAGNOSIS — W270XXA Contact with workbench tool, initial encounter: Secondary | ICD-10-CM | POA: Diagnosis not present

## 2015-07-21 DIAGNOSIS — Z91040 Latex allergy status: Secondary | ICD-10-CM | POA: Insufficient documentation

## 2015-07-21 DIAGNOSIS — F17200 Nicotine dependence, unspecified, uncomplicated: Secondary | ICD-10-CM | POA: Diagnosis not present

## 2015-07-21 DIAGNOSIS — Z8659 Personal history of other mental and behavioral disorders: Secondary | ICD-10-CM | POA: Diagnosis not present

## 2015-07-21 DIAGNOSIS — Z8719 Personal history of other diseases of the digestive system: Secondary | ICD-10-CM | POA: Diagnosis not present

## 2015-07-21 DIAGNOSIS — Y990 Civilian activity done for income or pay: Secondary | ICD-10-CM | POA: Diagnosis not present

## 2015-07-21 DIAGNOSIS — S61412A Laceration without foreign body of left hand, initial encounter: Secondary | ICD-10-CM | POA: Insufficient documentation

## 2015-07-21 DIAGNOSIS — Z862 Personal history of diseases of the blood and blood-forming organs and certain disorders involving the immune mechanism: Secondary | ICD-10-CM | POA: Insufficient documentation

## 2015-07-21 MED ORDER — LIDOCAINE-EPINEPHRINE (PF) 1 %-1:200000 IJ SOLN
INTRAMUSCULAR | Status: AC
  Filled 2015-07-21: qty 10

## 2015-07-21 MED ORDER — LIDOCAINE HCL (PF) 1 % IJ SOLN
INTRAMUSCULAR | Status: AC
  Filled 2015-07-21: qty 5

## 2015-07-21 MED ORDER — LIDOCAINE HCL (PF) 1 % IJ SOLN
30.0000 mL | Freq: Once | INTRAMUSCULAR | Status: AC
  Administered 2015-07-21: 12 mL

## 2015-07-21 MED ORDER — LIDOCAINE HCL (PF) 1 % IJ SOLN
INTRAMUSCULAR | Status: AC
  Administered 2015-07-21: 12 mL
  Filled 2015-07-21: qty 5

## 2015-07-21 NOTE — ED Notes (Signed)
Suture cart at bedside 

## 2015-07-21 NOTE — Discharge Instructions (Signed)
Suture removal in 7-10 days.  Laceration Care, Adult A laceration is a cut that goes through all layers of the skin. The cut also goes into the tissue that is right under the skin. Some cuts heal on their own. Others need to be closed with stitches (sutures), staples, skin adhesive strips, or wound glue. Taking care of your cut lowers your risk of infection and helps your cut to heal better. HOW TO TAKE CARE OF YOUR CUT For stitches or staples:  Keep the wound clean and dry.  If you were given a bandage (dressing), you should change it at least one time per day or as told by your doctor. You should also change it if it gets wet or dirty.  Keep the wound completely dry for the first 24 hours or as told by your doctor. After that time, you may take a shower or a bath. However, make sure that the wound is not soaked in water until after the stitches or staples have been removed.  Clean the wound one time each day or as told by your doctor:  Wash the wound with soap and water.  Rinse the wound with water until all of the soap comes off.  Pat the wound dry with a clean towel. Do not rub the wound.  After you clean the wound, put a thin layer of antibiotic ointment on it as told by your doctor. This ointment:  Helps to prevent infection.  Keeps the bandage from sticking to the wound.  Have your stitches or staples removed as told by your doctor. If your doctor used skin adhesive strips:   Keep the wound clean and dry.  If you were given a bandage, you should change it at least one time per day or as told by your doctor. You should also change it if it gets dirty or wet.  Do not get the skin adhesive strips wet. You can take a shower or a bath, but be careful to keep the wound dry.  If the wound gets wet, pat it dry with a clean towel. Do not rub the wound.  Skin adhesive strips fall off on their own. You can trim the strips as the wound heals. Do not remove any strips that are still  stuck to the wound. They will fall off after a while. If your doctor used wound glue:  Try to keep your wound dry, but you may briefly wet it in the shower or bath. Do not soak the wound in water, such as by swimming.  After you take a shower or a bath, gently pat the wound dry with a clean towel. Do not rub the wound.  Do not do any activities that will make you really sweaty until the skin glue has fallen off on its own.  Do not apply liquid, cream, or ointment medicine to your wound while the skin glue is still on.  If you were given a bandage, you should change it at least one time per day or as told by your doctor. You should also change it if it gets dirty or wet.  If a bandage is placed over the wound, do not let the tape for the bandage touch the skin glue.  Do not pick at the glue. The skin glue usually stays on for 5-10 days. Then, it falls off of the skin. General Instructions  To help prevent scarring, make sure to cover your wound with sunscreen whenever you are outside after stitches are  removed, after adhesive strips are removed, or when wound glue stays in place and the wound is healed. Make sure to wear a sunscreen of at least 30 SPF.  Take over-the-counter and prescription medicines only as told by your doctor.  If you were given antibiotic medicine or ointment, take or apply it as told by your doctor. Do not stop using the antibiotic even if your wound is getting better.  Do not scratch or pick at the wound.  Keep all follow-up visits as told by your doctor. This is important.  Check your wound every day for signs of infection. Watch for:  Redness, swelling, or pain.  Fluid, blood, or pus.  Raise (elevate) the injured area above the level of your heart while you are sitting or lying down, if possible. GET HELP IF:  You got a tetanus shot and you have any of these problems at the injection site:  Swelling.  Very bad pain.  Redness.  Bleeding.  You  have a fever.  A wound that was closed breaks open.  You notice a bad smell coming from your wound or your bandage.  You notice something coming out of the wound, such as wood or glass.  Medicine does not help your pain.  You have more redness, swelling, or pain at the site of your wound.  You have fluid, blood, or pus coming from your wound.  You notice a change in the color of your skin near your wound.  You need to change the bandage often because fluid, blood, or pus is coming from the wound.  You start to have a new rash.  You start to have numbness around the wound. GET HELP RIGHT AWAY IF:  You have very bad swelling around the wound.  Your pain suddenly gets worse and is very bad.  You notice painful lumps near the wound or on skin that is anywhere on your body.  You have a red streak going away from your wound.  The wound is on your hand or foot and you cannot move a finger or toe like you usually can.  The wound is on your hand or foot and you notice that your fingers or toes look pale or bluish.   This information is not intended to replace advice given to you by your health care provider. Make sure you discuss any questions you have with your health care provider.   Document Released: 11/03/2007 Document Revised: 10/01/2014 Document Reviewed: 05/13/2014 Elsevier Interactive Patient Education [employer]

## 2015-07-21 NOTE — ED Notes (Signed)
Physician in to assess. Pt has a jagged cut to his palmar surface of his L hand, He reports that he was at work trying to clear sawdust from a table saw that he believed to be off- There was a call prior to his coming back to the treatment area from "his wife"- pt reports he has no wife, caller stated that pt had cut hand intentionally. Pt adamantly denies this report. States that he and his ex girlfriend have been fighting a lot, that he is estranged from his mother and father, but insists that his accident was that- an accident. He is NV intact with full flexion extension of his digits, as well as sensation thruout.

## 2015-07-21 NOTE — ED Provider Notes (Signed)
CSN: [id number]     Arrival date & time 07/21/15  1427 History   First MD Initiated Contact with Patient 07/21/15 1653     Chief Complaint  Patient presents with  . Extremity Laceration      HPI  She presents for evaluation of a left hand laceration. He states that he was doing some "side working "her friend helping him cut wood. He was feeding wood into a large table saw. He states became jammed. He removed the wood. He reached underneath the table to clear a jam and cut his hand into the blade.  No pulsatile bleeding. Bleeding resolved or improved with simple dressing. He is wrapped in a towel. He is able to flex and extend his digits.  Patient denies that this is a self-inflicted injury. He states initially that it was "self-inflicted" stating that I did it myself but it was an accident,  wasn't trying to hurt myself.  Past Medical History  Diagnosis Date  . ADHD (attention deficit hyperactivity disorder)   . Anemia   . Thrombocytopenia (HCC)   . Cellulitis   . Tobacco use   . GERD (gastroesophageal reflux disease)    History reviewed. No pertinent past surgical history. No family history on file. Social History  Substance Use Topics  . Smoking status: Current Every Day Smoker  . Smokeless tobacco: Never Used  . Alcohol Use: Yes    Review of Systems  Constitutional: Negative for fever, chills, diaphoresis, appetite change and fatigue.  HENT: Negative for mouth sores, sore throat and trouble swallowing.   Eyes: Negative for visual disturbance.  Respiratory: Negative for cough, chest tightness, shortness of breath and wheezing.   Cardiovascular: Negative for chest pain.  Gastrointestinal: Negative for nausea, vomiting, abdominal pain, diarrhea and abdominal distention.  Endocrine: Negative for polydipsia, polyphagia and polyuria.  Genitourinary: Negative for dysuria, frequency and hematuria.  Musculoskeletal: Negative for gait problem.  Skin: Positive for wound. Negative  for color change, pallor and rash.  Neurological: Negative for dizziness, syncope, light-headedness and headaches.  Hematological: Does not bruise/bleed easily.  Psychiatric/Behavioral: Negative for behavioral problems and confusion.      Allergies  Other and Latex  Home Medications   Prior to Admission medications   Not on File   BP 128/79 mmHg  Pulse 74  Temp(Src) 98.4 F (36.9 C) (Oral)  Resp 16  Ht 6' (1.829 m)  Wt 145 lb (65.772 kg)  BMI 19.66 kg/m2  SpO2 100% Physical Exam  Constitutional: He is oriented to person, place, and time. He appears well-developed and well-nourished. No distress.  HENT:  Head: Normocephalic.  Eyes: Conjunctivae are normal. Pupils are equal, round, and reactive to light. No scleral icterus.  Neck: Normal range of motion. Neck supple. No thyromegaly present.  Cardiovascular: Normal rate and regular rhythm.  Exam reveals no gallop and no friction rub.   No murmur heard. Pulmonary/Chest: Effort normal and breath sounds normal. No respiratory distress. He has no wheezes. He has no rales.  Abdominal: Soft. Bowel sounds are normal. He exhibits no distension. There is no tenderness. There is no rebound.  Musculoskeletal: Normal range of motion.       Hands: Neurological: He is alert and oriented to person, place, and time.  Skin: Skin is warm and dry. No rash noted.  Psychiatric: He has a normal mood and affect. His behavior is normal.    ED Course  Procedures (including critical care time) Labs Review Labs Reviewed - No data to display  Imaging Review Dg Hand Complete Left  07/21/2015  CLINICAL DATA:  Laceration. Hit palm of left hand with axe today. Initial encounter. EXAM: LEFT HAND - COMPLETE 3+ VIEW COMPARISON:  None. FINDINGS: Soft tissue injury/laceration is noted involving the palm. No acute fracture, dislocation, or radiopaque foreign body is identified. Bone mineralization appears normal. IMPRESSION: Soft tissue injury without acute  osseous abnormality or radiopaque foreign body identified. Electronically Signed   By: [name] Ache M.D.   On: 07/21/2015 15:01   I have personally reviewed and evaluated these images and lab results as part of my medical decision-making.   EKG Interpretation None      MDM   Final diagnoses:  Hand laceration, left, initial encounter    LACERATION REPAIR Performed by: Claudean Kinds Authorized by: Claudean Kinds Consent: Verbal consent obtained. Risks and benefits: risks, benefits and alternatives were discussed Consent given by: patient Patient identity confirmed: provided demographic data Prepped and Draped in normal sterile fashion Wound explored  Laceration Location: Lt hand Hypothenar  Laceration Length: 6cmcm  No Foreign Bodies seen or palpated  Anesthesia: local infiltration  Local anesthetic: lidocaine 1% s epinephrine  Anesthetic total: 8 ml  Irrigation method: syringe Amount of cleaning: standard  Skin closure: 4-0 Nylon  Number of sutures: 5  Technique: Mattress  Patient tolerance: Patient tolerated the procedure well with no immediate complications.  Patient demonstrates full FDS, and after the functional digits. He has intact sensation on all her and radial aspect of all digits. He has some decreased sensation immediately distal to the laceration but has no sign of peripheral nerve injury. Immediate catheter refill in all digits. Stable hand to examine. Normal x-ray.     Rolland Porter, MD 07/21/15 [phone]

## 2015-07-21 NOTE — ED Notes (Signed)
On cell phone, awaiting wound repair

## 2015-07-21 NOTE — ED Notes (Signed)
Pt continues to insist that he is not planning to harm himself nor was he today. DCd per MD instruct. Verbalizes understanding of Dx, sx of infection, follow up

## 2015-07-21 NOTE — ED Notes (Signed)
Laceration to left hand which he cut with an axe today ~1200. Pt states he has little sensation to his hand. Bleeding is under control.

## 2015-07-27 ENCOUNTER — Emergency Department (HOSPITAL_COMMUNITY)
Admission: EM | Admit: 2015-07-27 | Discharge: 2015-07-28 | Disposition: A | Discharge status: Other Acute Inpatient Hospital | Payer: PRIVATE HEALTH INSURANCE | Attending: Emergency Medicine | Admitting: Emergency Medicine

## 2015-07-27 ENCOUNTER — Ambulatory Visit (HOSPITAL_COMMUNITY)
Admission: RE | Admit: 2015-07-27 | Discharge: 2015-07-27 | Disposition: A | Discharge status: Home / Self Care | Payer: PRIVATE HEALTH INSURANCE | Source: Home / Self Care | Attending: Psychiatry | Admitting: Psychiatry

## 2015-07-27 ENCOUNTER — Encounter (HOSPITAL_COMMUNITY): Payer: Self-pay | Admitting: Family Medicine

## 2015-07-27 DIAGNOSIS — F1120 Opioid dependence, uncomplicated: Secondary | ICD-10-CM

## 2015-07-27 DIAGNOSIS — R062 Wheezing: Secondary | ICD-10-CM | POA: Diagnosis not present

## 2015-07-27 DIAGNOSIS — F1210 Cannabis abuse, uncomplicated: Secondary | ICD-10-CM | POA: Diagnosis not present

## 2015-07-27 DIAGNOSIS — Z872 Personal history of diseases of the skin and subcutaneous tissue: Secondary | ICD-10-CM | POA: Diagnosis not present

## 2015-07-27 DIAGNOSIS — F322 Major depressive disorder, single episode, severe without psychotic features: Secondary | ICD-10-CM | POA: Diagnosis not present

## 2015-07-27 DIAGNOSIS — Z8719 Personal history of other diseases of the digestive system: Secondary | ICD-10-CM | POA: Diagnosis not present

## 2015-07-27 DIAGNOSIS — Z91040 Latex allergy status: Secondary | ICD-10-CM | POA: Insufficient documentation

## 2015-07-27 DIAGNOSIS — F32A Depression, unspecified: Secondary | ICD-10-CM

## 2015-07-27 DIAGNOSIS — S61412D Laceration without foreign body of left hand, subsequent encounter: Secondary | ICD-10-CM | POA: Diagnosis not present

## 2015-07-27 DIAGNOSIS — F17210 Nicotine dependence, cigarettes, uncomplicated: Secondary | ICD-10-CM | POA: Diagnosis not present

## 2015-07-27 DIAGNOSIS — X789XXD Intentional self-harm by unspecified sharp object, subsequent encounter: Secondary | ICD-10-CM | POA: Insufficient documentation

## 2015-07-27 DIAGNOSIS — Z7289 Other problems related to lifestyle: Secondary | ICD-10-CM

## 2015-07-27 DIAGNOSIS — IMO0002 Reserved for concepts with insufficient information to code with codable children: Secondary | ICD-10-CM

## 2015-07-27 DIAGNOSIS — R45851 Suicidal ideations: Secondary | ICD-10-CM | POA: Insufficient documentation

## 2015-07-27 DIAGNOSIS — Z008 Encounter for other general examination: Secondary | ICD-10-CM | POA: Diagnosis present

## 2015-07-27 DIAGNOSIS — F1110 Opioid abuse, uncomplicated: Secondary | ICD-10-CM

## 2015-07-27 DIAGNOSIS — F329 Major depressive disorder, single episode, unspecified: Secondary | ICD-10-CM

## 2015-07-27 DIAGNOSIS — Z862 Personal history of diseases of the blood and blood-forming organs and certain disorders involving the immune mechanism: Secondary | ICD-10-CM | POA: Diagnosis not present

## 2015-07-27 LAB — ACETAMINOPHEN LEVEL: Acetaminophen (Tylenol), Serum: <10 ug/mL — ABNORMAL LOW (ref 10–30)

## 2015-07-27 LAB — COMPREHENSIVE METABOLIC PANEL
ALK PHOS: 72 U/L (ref 38–126)
ALT: 11 U/L — AB (ref 17–63)
AST: 15 U/L (ref 15–41)
Albumin: 4.9 g/dL (ref 3.5–5.0)
Anion gap: 9 (ref 5–15)
BUN: 12 mg/dL (ref 6–20)
CALCIUM: 9.5 mg/dL (ref 8.9–10.3)
CO2: 26 mmol/L (ref 22–32)
CREATININE: 0.8 mg/dL (ref 0.61–1.24)
Chloride: 102 mmol/L (ref 101–111)
GFR calc Af Amer: >60 mL/min (ref >60)
Glucose, Bld: 101 mg/dL — ABNORMAL HIGH (ref 65–99)
Potassium: 4.4 mmol/L (ref 3.5–5.1)
Sodium: 137 mmol/L (ref 135–145)
TOTAL PROTEIN: 7.8 g/dL (ref 6.5–8.1)
Total Bilirubin: 0.7 mg/dL (ref 0.3–1.2)

## 2015-07-27 LAB — CBC
HEMATOCRIT: 45.1 % (ref 39.0–52.0)
Hemoglobin: 15.5 g/dL (ref 13.0–17.0)
MCH: 32.2 pg (ref 26.0–34.0)
MCHC: 34.4 g/dL (ref 30.0–36.0)
MCV: 93.8 fL (ref 78.0–100.0)
PLATELETS: 155 10*3/uL (ref 150–400)
RBC: 4.81 MIL/uL (ref 4.22–5.81)
RDW: 12.4 % (ref 11.5–15.5)
WBC: 9.8 10*3/uL (ref 4.0–10.5)

## 2015-07-27 LAB — RAPID URINE DRUG SCREEN, HOSP PERFORMED
Amphetamines: NOT DETECTED
BARBITURATES: NOT DETECTED
BENZODIAZEPINES: NOT DETECTED
COCAINE: NOT DETECTED
Opiates: NOT DETECTED
Tetrahydrocannabinol: POSITIVE — AB

## 2015-07-27 LAB — SALICYLATE LEVEL

## 2015-07-27 LAB — ETHANOL

## 2015-07-27 MED ORDER — DICYCLOMINE HCL 20 MG PO TABS: 20.0000 mg | ORAL_TABLET | Freq: Four times a day (QID) | ORAL | Status: DC | PRN

## 2015-07-27 MED ORDER — ZOLPIDEM TARTRATE 5 MG PO TABS
5.0000 mg | ORAL_TABLET | Freq: Every evening | ORAL | Status: DC | PRN
  Administered 2015-07-27: 5 mg via ORAL
  Filled 2015-07-27: qty 1

## 2015-07-27 MED ORDER — ONDANSETRON HCL 4 MG PO TABS: 4.0000 mg | ORAL_TABLET | Freq: Three times a day (TID) | ORAL | Status: DC | PRN

## 2015-07-27 MED ORDER — ALBUTEROL SULFATE HFA 108 (90 BASE) MCG/ACT IN AERS
2.0000 | INHALATION_SPRAY | Freq: Once | RESPIRATORY_TRACT | Status: AC
  Administered 2015-07-27: 2 via RESPIRATORY_TRACT
  Filled 2015-07-27 (×2): qty 6.7

## 2015-07-27 MED ORDER — ONDANSETRON 4 MG PO TBDP: 4.0000 mg | ORAL_TABLET | Freq: Four times a day (QID) | ORAL | Status: DC | PRN

## 2015-07-27 MED ORDER — METHOCARBAMOL 500 MG PO TABS
500.0000 mg | ORAL_TABLET | Freq: Three times a day (TID) | ORAL | Status: DC | PRN
  Administered 2015-07-28: 500 mg via ORAL
  Filled 2015-07-27: qty 1

## 2015-07-27 MED ORDER — IBUPROFEN 200 MG PO TABS: 600.0000 mg | ORAL_TABLET | Freq: Three times a day (TID) | ORAL | Status: DC | PRN

## 2015-07-27 MED ORDER — NICOTINE 21 MG/24HR TD PT24
21.0000 mg | MEDICATED_PATCH | Freq: Every day | TRANSDERMAL | Status: DC
  Administered 2015-07-27 – 2015-07-28 (×2): 21 mg via TRANSDERMAL
  Filled 2015-07-27 (×2): qty 1

## 2015-07-27 MED ORDER — CLONIDINE HCL 0.1 MG PO TABS: 0.1000 mg | ORAL_TABLET | ORAL | Status: DC

## 2015-07-27 MED ORDER — NAPROXEN 500 MG PO TABS
500.0000 mg | ORAL_TABLET | Freq: Two times a day (BID) | ORAL | Status: DC | PRN
  Administered 2015-07-27: 500 mg via ORAL
  Filled 2015-07-27: qty 1

## 2015-07-27 MED ORDER — LORAZEPAM 1 MG PO TABS
1.0000 mg | ORAL_TABLET | Freq: Three times a day (TID) | ORAL | Status: DC | PRN
  Administered 2015-07-28: 1 mg via ORAL
  Filled 2015-07-27: qty 1

## 2015-07-27 MED ORDER — CLONIDINE HCL 0.1 MG PO TABS: 0.1000 mg | ORAL_TABLET | Freq: Every day | ORAL | Status: DC

## 2015-07-27 MED ORDER — LOPERAMIDE HCL 2 MG PO CAPS: 2.0000 mg | ORAL_CAPSULE | ORAL | Status: DC | PRN

## 2015-07-27 MED ORDER — ALUM & MAG HYDROXIDE-SIMETH 200-200-20 MG/5ML PO SUSP: 30.0000 mL | ORAL | Status: DC | PRN

## 2015-07-27 MED ORDER — HYDROXYZINE HCL 25 MG PO TABS
25.0000 mg | ORAL_TABLET | Freq: Four times a day (QID) | ORAL | Status: DC | PRN
  Administered 2015-07-28: 25 mg via ORAL
  Filled 2015-07-27: qty 1

## 2015-07-27 MED ORDER — ACETAMINOPHEN 325 MG PO TABS: 650.0000 mg | ORAL_TABLET | ORAL | Status: DC | PRN

## 2015-07-27 MED ORDER — CLONIDINE HCL 0.1 MG PO TABS
0.1000 mg | ORAL_TABLET | Freq: Four times a day (QID) | ORAL | Status: DC
  Administered 2015-07-27 – 2015-07-28 (×3): 0.1 mg via ORAL
  Filled 2015-07-27 (×3): qty 1

## 2015-07-27 NOTE — ED Provider Notes (Signed)
CSN: [id number]     Arrival date & time 07/27/15  1611 History   First MD Initiated Contact with Patient 07/27/15 1657     Chief Complaint  Patient presents with  . Psychiatric Evaluation     (Consider location/radiation/quality/duration/timing/severity/associated sxs/prior Treatment) HPI   Patient sent to ED by Behavioral Health for medical clearance SI and opiate addiction.  Has longstanding issues with depression and anger.  Has SI with plan to overdose on opiates.  In the past three days he has used cocaine, marijuana, pain pills, and benzos.  He smokes cigarettes.  Does not drink ETOH.  Does not use benzos regularly.   Pt reports no medical complaints currently with exception of pain in his left hand following suture repair.  He admits now that this was intentional self harm.    Denies fevers, redness, swelling, discharge/bleeding, weakness or numbness of the hand.   Denies CP, SOB, cough.  No physical concerns presently.    Past Medical History  Diagnosis Date  . ADHD (attention deficit hyperactivity disorder)   . Anemia   . Thrombocytopenia (HCC)   . Cellulitis   . Tobacco use   . GERD (gastroesophageal reflux disease)    History reviewed. No pertinent past surgical history. History reviewed. No pertinent family history. Social History  Substance Use Topics  . Smoking status: Current Every Day Smoker -- 1.00 packs/day for 10 years    Types: Cigarettes  . Smokeless tobacco: Never Used  . Alcohol Use: No    Review of Systems  All other systems reviewed and are negative.     Allergies  Other and Latex  Home Medications   Prior to Admission medications   Not on File   BP 135/102 mmHg  Pulse 66  Temp(Src) 97.3 F (36.3 C) (Oral)  Resp 18  Ht  (1.803 m)  Wt 65.772 kg  BMI 20.23 kg/m2  SpO2 98% Physical Exam  Constitutional: He appears well-developed and well-nourished. No distress.  HENT:  Head: Normocephalic and atraumatic.  Neck: Neck supple.   Cardiovascular: Normal rate and regular rhythm.   Pulmonary/Chest: Effort normal. No respiratory distress. He has wheezes. He has no rales.  Abdominal: Soft. He exhibits no distension and no mass. There is no tenderness. There is no rebound and no guarding.  Musculoskeletal:  Left hand with laceration with sutures intact over thenar eminence.  No erythema, edema, warmth, discharge.  Moves all fingers, capillary refill < 3 seconds, sensation intact.   Neurological: He is alert. He exhibits normal muscle tone.  Skin: He is not diaphoretic.  Nursing note and vitals reviewed.   ED Course  Procedures (including critical care time) Labs Review Labs Reviewed  COMPREHENSIVE METABOLIC PANEL - Abnormal; Notable for the following:    Glucose, Bld 101 (*)    ALT 11 (*)    All other components within normal limits  ACETAMINOPHEN LEVEL - Abnormal; Notable for the following:    Acetaminophen (Tylenol), Serum <10 (*)    All other components within normal limits  URINE RAPID DRUG SCREEN, HOSP PERFORMED - Abnormal; Notable for the following:    Tetrahydrocannabinol POSITIVE (*)    All other components within normal limits  ETHANOL  SALICYLATE LEVEL  CBC    Imaging Review No results found. I have personally reviewed and evaluated these images and lab results as part of my medical decision-making.   EKG Interpretation None      MDM   Final diagnoses:  Depression  Self-inflicted injury  Opiate abuse, continuous    Pt with hx depression, opiate abuse, sent for medical clearance by behavioral health pending inpatient admission for suicidal ideation with plan to die by overdose. Medically cleared.  Pending placement.      Trixie Dredge, PA-C 07/27/15 2115  Pricilla Loveless, MD 07/29/15 [phone]

## 2015-07-27 NOTE — ED Notes (Signed)
Patient appears pleasant, cooperative. Denies SI, HI, AVH. Denies any withdraw symptoms at present. Reports mild pain in left hand at laceration/suture site. Declines intervention at present.  Encouragement offered. Environment adjusted.   Q 15 safety checks continue

## 2015-07-27 NOTE — ED Notes (Signed)
Pt reports he is experiencing suicidal thoughts and reports he has an opiate problem. Pt reports his plan would be to overdose on any medication. Pt reports these feelings have been intermittent thoughts depending on his mood and "what I am on". Pt states "I want to get clean", "I don't want my family to worry about me anymore".

## 2015-07-27 NOTE — ED Notes (Signed)
Patient requesting opiate detox.  States last suboxone (from "off the streets") this am. No curent withdrawal symptoms.Passive thoughts of SI with plan to overdose.  Pleasant and cooperative.  Snack given and POC addressed. NAD
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
## 2015-07-28 ENCOUNTER — Inpatient Hospital Stay (HOSPITAL_COMMUNITY)
Admission: AD | Admit: 2015-07-28 | Discharge: 2015-08-01 | DRG: 885 | Disposition: A | Discharge status: Home / Self Care | Payer: PRIVATE HEALTH INSURANCE | Source: Intra-hospital | Attending: Psychiatry | Admitting: Psychiatry

## 2015-07-28 ENCOUNTER — Encounter (HOSPITAL_COMMUNITY): Payer: Self-pay | Admitting: *Deleted

## 2015-07-28 DIAGNOSIS — R45851 Suicidal ideations: Secondary | ICD-10-CM

## 2015-07-28 DIAGNOSIS — F322 Major depressive disorder, single episode, severe without psychotic features: Secondary | ICD-10-CM | POA: Diagnosis not present

## 2015-07-28 DIAGNOSIS — F1120 Opioid dependence, uncomplicated: Secondary | ICD-10-CM | POA: Diagnosis present

## 2015-07-28 DIAGNOSIS — F329 Major depressive disorder, single episode, unspecified: Secondary | ICD-10-CM | POA: Diagnosis present

## 2015-07-28 DIAGNOSIS — F332 Major depressive disorder, recurrent severe without psychotic features: Secondary | ICD-10-CM | POA: Diagnosis present

## 2015-07-28 DIAGNOSIS — G4700 Insomnia, unspecified: Secondary | ICD-10-CM | POA: Diagnosis present

## 2015-07-28 DIAGNOSIS — F17210 Nicotine dependence, cigarettes, uncomplicated: Secondary | ICD-10-CM | POA: Diagnosis present

## 2015-07-28 MED ORDER — NICOTINE 21 MG/24HR TD PT24
21.0000 mg | MEDICATED_PATCH | Freq: Every day | TRANSDERMAL | Status: DC
  Administered 2015-07-29 – 2015-08-01 (×4): 21 mg via TRANSDERMAL
  Filled 2015-07-28 (×7): qty 1

## 2015-07-28 MED ORDER — HYDROXYZINE HCL 25 MG PO TABS
25.0000 mg | ORAL_TABLET | Freq: Four times a day (QID) | ORAL | Status: AC | PRN
  Administered 2015-07-28 – 2015-07-31 (×5): 25 mg via ORAL
  Filled 2015-07-28 (×5): qty 1

## 2015-07-28 MED ORDER — ALBUTEROL SULFATE HFA 108 (90 BASE) MCG/ACT IN AERS
2.0000 | INHALATION_SPRAY | RESPIRATORY_TRACT | Status: DC | PRN
  Administered 2015-07-28: 2 via RESPIRATORY_TRACT

## 2015-07-28 MED ORDER — METHOCARBAMOL 500 MG PO TABS
500.0000 mg | ORAL_TABLET | Freq: Three times a day (TID) | ORAL | Status: AC | PRN
  Administered 2015-07-28 – 2015-07-31 (×5): 500 mg via ORAL
  Filled 2015-07-28 (×5): qty 1

## 2015-07-28 MED ORDER — ACETAMINOPHEN 325 MG PO TABS
650.0000 mg | ORAL_TABLET | Freq: Four times a day (QID) | ORAL | Status: DC | PRN
  Administered 2015-07-28: 650 mg via ORAL
  Filled 2015-07-28: qty 2

## 2015-07-28 MED ORDER — CLONIDINE HCL 0.1 MG PO TABS
0.1000 mg | ORAL_TABLET | ORAL | Status: AC
  Administered 2015-07-30 – 2015-08-01 (×4): 0.1 mg via ORAL
  Filled 2015-07-28 (×4): qty 1

## 2015-07-28 MED ORDER — DICYCLOMINE HCL 20 MG PO TABS
20.0000 mg | ORAL_TABLET | Freq: Four times a day (QID) | ORAL | Status: AC | PRN
  Administered 2015-07-29 – 2015-07-31 (×5): 20 mg via ORAL
  Filled 2015-07-28 (×5): qty 1

## 2015-07-28 MED ORDER — LOPERAMIDE HCL 2 MG PO CAPS: 2.0000 mg | ORAL_CAPSULE | ORAL | Status: AC | PRN

## 2015-07-28 MED ORDER — ALBUTEROL SULFATE HFA 108 (90 BASE) MCG/ACT IN AERS
2.0000 | INHALATION_SPRAY | RESPIRATORY_TRACT | Status: DC | PRN
  Administered 2015-07-28 – 2015-08-01 (×11): 2 via RESPIRATORY_TRACT
  Filled 2015-07-28: qty 6.7

## 2015-07-28 MED ORDER — ALUM & MAG HYDROXIDE-SIMETH 200-200-20 MG/5ML PO SUSP: 30.0000 mL | ORAL | Status: DC | PRN

## 2015-07-28 MED ORDER — NAPROXEN 500 MG PO TABS
500.0000 mg | ORAL_TABLET | Freq: Two times a day (BID) | ORAL | Status: AC | PRN
  Administered 2015-07-28 – 2015-07-31 (×6): 500 mg via ORAL
  Filled 2015-07-28 (×6): qty 1

## 2015-07-28 MED ORDER — CLONIDINE HCL 0.1 MG PO TABS
0.1000 mg | ORAL_TABLET | Freq: Every day | ORAL | Status: DC
  Filled 2015-07-28 (×2): qty 1

## 2015-07-28 MED ORDER — ENSURE ENLIVE PO LIQD
237.0000 mL | Freq: Two times a day (BID) | ORAL | Status: DC
  Administered 2015-07-29 – 2015-07-30 (×4): 237 mL via ORAL

## 2015-07-28 MED ORDER — MAGNESIUM HYDROXIDE 400 MG/5ML PO SUSP: 30.0000 mL | Freq: Every day | ORAL | Status: DC | PRN

## 2015-07-28 MED ORDER — CLONIDINE HCL 0.1 MG PO TABS
0.1000 mg | ORAL_TABLET | Freq: Four times a day (QID) | ORAL | Status: AC
  Administered 2015-07-28 – 2015-07-30 (×7): 0.1 mg via ORAL
  Filled 2015-07-28 (×10): qty 1

## 2015-07-28 MED ORDER — ONDANSETRON 4 MG PO TBDP: 4.0000 mg | ORAL_TABLET | Freq: Four times a day (QID) | ORAL | Status: AC | PRN

## 2015-07-28 NOTE — BHH Group Notes (Signed)
Pt attended AA group.  Mathew Bailey, MHT 

## 2015-07-28 NOTE — Progress Notes (Signed)
D   Pt is pleasant on approach and cooperative with treatment    He complains of some moderate withdrawal including body and joint aches and increased anxiety    A   Verbal support given   Medications administered and effectiveness monitored   Q 15 min checks R    Pt safe at present and receptive to verbal support

## 2015-07-28 NOTE — BH Assessment (Signed)
BHH Assessment Progress Note  Per Carolanne Grumbling, MD, this pt requires psychiatric hospitalization at this time.  Berneice Heinrich, RN, [facility] has assigned pt to [facility] Rm [id number].  Pt has signed Voluntary Admission and Consent for Treatment, as well as Consent to Release Information to family, and signed forms have been faxed to [facility].  Pt's nurse, Diane, has been notified, and agrees to send original paperwork along with pt via Juel Burrow, and to call report to [phone].  Doylene Canning, MA Triage Specialist [phone]

## 2015-07-28 NOTE — ED Notes (Signed)
Pt is A&O x3. He has been cooperative. He admits to using opiates and reports withdrawal symptoms, like diarrhea and muscle aching/cramping. Transported to [facility] by [facility] for further treatment.
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
## 2015-07-28 NOTE — Tx Team (Signed)
Initial Interdisciplinary Treatment Plan   PATIENT STRESSORS: Loss of relationship with girlfriend Substance abuse Occupational concerns  PATIENT STRENGTHS: Ability for insight Average or above average intelligence Communication skills General fund of knowledge Motivation for treatment/growth Physical Health Supportive family/friends   PROBLEM LIST: Problem List/Patient Goals Date to be addressed Date deferred Reason deferred Estimated date of resolution  "I want to get clean." 07/28/15           "I want to be a better person in general." 07/28/15                 Opiate dependence 07/28/15     Depressive mood 07/28/15     SI to OD, cut himself 07/28/15            DISCHARGE CRITERIA:  Improved stabilization in mood, thinking, and/or behavior Need for constant or close observation no longer present Reduction of life-threatening or endangering symptoms to within safe limits Withdrawal symptoms are absent or subacute and managed without 24-hour nursing intervention  PRELIMINARY DISCHARGE PLAN: Attend aftercare/continuing care group Attend 12-step recovery group Outpatient therapy Return to previous living arrangement  PATIENT/FAMIILY INVOLVEMENT: This treatment plan has been presented to and reviewed with the patient, Mathew Bailey, and/or family member.  The patient and family have been given the opportunity to ask questions and make suggestions.  Lawrence Marseilles 07/28/2015, 6:05 PM
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
## 2015-07-29 ENCOUNTER — Encounter (HOSPITAL_COMMUNITY): Payer: Self-pay | Admitting: Psychiatry

## 2015-07-29 DIAGNOSIS — F332 Major depressive disorder, recurrent severe without psychotic features: Principal | ICD-10-CM

## 2015-07-29 DIAGNOSIS — F1120 Opioid dependence, uncomplicated: Secondary | ICD-10-CM

## 2015-07-29 MED ORDER — TRAZODONE HCL 50 MG PO TABS
50.0000 mg | ORAL_TABLET | Freq: Every evening | ORAL | Status: DC | PRN
  Administered 2015-07-29 – 2015-07-31 (×4): 50 mg via ORAL
  Filled 2015-07-29 (×4): qty 1

## 2015-07-29 MED ORDER — TRAMADOL HCL 50 MG PO TABS
50.0000 mg | ORAL_TABLET | Freq: Four times a day (QID) | ORAL | Status: DC | PRN
  Administered 2015-07-29 – 2015-07-31 (×4): 50 mg via ORAL
  Filled 2015-07-29 (×4): qty 1
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
## 2015-07-29 NOTE — BHH Counselor (Signed)
Adult Comprehensive Assessment  Patient ID: Mathew Bailey, male   DOB: [birth year], 25 y.o.   MRN: [id number]  Information Source: Information source: Patient   Living/Environment/Situation:  Living Arrangements: Parent Living conditions (as described by patient or guardian): living with parents for past week.  How long has patient lived in current situation?: prior to this, pt was living with long time gf "we probably broke up."  What is atmosphere in current home: Comfortable  Family History:  Marital status: Long term relationship Long term relationship, how long?: five years. "we are not speaking now because of my opiate addiction." What types of issues is patient dealing with in the relationship?: n/a  Additional relationship information: n/a  Are you sexually active?: No What is your sexual orientation?: heterosexual  Has your sexual activity been affected by drugs, alcohol, medication, or emotional stress?: n/a  Does patient have children?: No  Childhood History:  By whom was/is the patient raised?: Both parents Additional childhood history information: My dad has severe anger issues. "I feel like I cause alot of his anger." mom has really bad diabetes and anxiety Description of patient's relationship with caregiver when they were a child: close to mother; close to father  Patient's description of current relationship with people who raised him/her: strained from both parents  How were you disciplined when you got in trouble as a child/adolescent?: n/a  Does patient have siblings?: No Did patient suffer any verbal/emotional/physical/sexual abuse as a child?: No Did patient suffer from severe childhood neglect?: No Has patient ever been sexually abused/assaulted/raped as an adolescent or adult?: No Was the patient ever a victim of a crime or a disaster?: No Witnessed domestic violence?: No Has patient been effected by domestic violence as an adult?: No  Education:  Highest  grade of school patient has completed: 12th grade Currently a student?: No Learning disability?: No  Employment/Work Situation:   Employment situation: Employed Where is patient currently employed?: [employer] making [employer] How long has patient been employed?: 4 months-"I got let go but can come back after treatment."  Patient's job has been impacted by current illness: No What is the longest time patient has a held a job?: 2 years 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 time?: 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 Has patient ever been in the [employer]?: No Has patient ever served in combat?: No Did You Receive Any Psychiatric Treatment/Services While in Equities trader?: No Are There Guns or Education officer, community in Your Home?: No Are These Comptroller?:  (n/a)  Financial Resources:   Surveyor, quantity resources: Support from parents / caregiver, Media planner Does patient have a Lawyer or guardian?: No  Alcohol/Substance Abuse:   What has been your use of drugs/alcohol within the last 12 months?: "I've smoke marijuana everyday since 12." Opiates-daily;  or above depending on my finances.  If attempted suicide, did drugs/alcohol play a role in this?: No Alcohol/Substance Abuse Treatment Hx: Denies past history If yes, describe treatment: n/a  Has alcohol/substance abuse ever caused legal problems?: No  Social Support System:   Patient's Community Support System: Fair Museum/gallery exhibitions officer System: some friends.  Type of faith/religion: n/a  How does patient's faith help to cope with current illness?: n/a   Leisure/Recreation:   Leisure and Hobbies: love to play sports all around. skateboard and traveling    Discharge Plan:   Does patient have access to transportation?: Yes Will patient be returning to same living situation after discharge?: No Plan for  living situation after discharge: wants to go to treatment Currently receiving community mental health services: No If no,  would patient like referral for services when discharged?: Yes (What county?) [facility] Does patient have financial barriers related to discharge medications?: No  Summary/Recommendations:   Summary and Recommendations (to be completed by the evaluator): Patient is 25 year old male [place] with his parents. He presents to the hospital seeking treatment for mood lability/depression, opiate addiction, SI, and for medication stabilization. Patient has a diagnosis of opiate abuse; severe and Major Depressive Disorder Severe. Recommendations for patient include: crisis stabillization, therapeutic milieu, encourage group attendance and participation, medication management for withdrawals/mood stabilization, and development of comprehensive mental wellness/sobriety plan.  Patient is requesting a referral to [facility]. CSW assessing.   Smart, Mathew Skoczylas LCSW 07/29/2015 4:07 PM

## 2015-07-29 NOTE — BHH Group Notes (Signed)
Pt attended AA meeting.  Husayn Reim, MHT 

## 2015-07-29 NOTE — Progress Notes (Signed)
D:Patient in his room on [facility].  Patient states he is having opiate withdrawal today.  Patient states this is the first time he has every detoxed and really wanted to do it.  Patient states in the past he has detoxed with intentions of going back to using.  Patient has been fixated on medications tonight and has to be reminded to give medications time to work.  Patient denies SI/HI and denies AVH.   A: Staff to monitor Q 15 mins for safety.  Encouragement and support offered.  Scheduled medications administered per orders.  Patient had received several prn medications tonight. R: Patient remains safe on the unit.  Patient attended group tonight but was in and out.  Patient visible on the unit and interacting with peers.  Patient taking administered medications.
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
## 2015-07-29 NOTE — BHH Suicide Risk Assessment (Signed)
[facility] Admission Suicide Risk Assessment   Nursing information obtained from:  Patient, Review of record Demographic factors:  Male, Adolescent or young adult, Caucasian, Unemployed Current Mental Status:  Suicidal ideation indicated by patient, Suicide plan, Plan includes specific time, place, or method Loss Factors:  Loss of significant relationship, Decrease in vocational status Historical Factors:  Prior suicide attempts, Family history of mental illness or substance abuse, Impulsivity Risk Reduction Factors:  Sense of responsibility to family, Living with another person, especially a relative, Positive social support, Positive therapeutic relationship  Total Time spent with patient: 45 minutes Principal Problem: Major depressive disorder, recurrent episode, severe (HCC) Diagnosis:   Patient Active Problem List   Diagnosis Date Noted  . Opioid use disorder, severe, dependence (HCC) [F11.20] 07/28/2015  . Major depressive disorder, recurrent episode, severe (HCC) [F33.2] 07/28/2015  . Cellulitis of foot, right [L03.115] 02/13/2014  . R Plantar Puncture wound [T14.8] 02/13/2014  . Tobacco abuse [Z72.0] 02/13/2014  . Cellulitis [L03.90] 02/13/2014  . Thrombocytopenia, unspecified (HCC) [D69.6] 01/22/2013   Subjective Data: see admission H and P  Continued Clinical Symptoms:  Alcohol Use Disorder Identification Test Final Score (AUDIT): 3 The "Alcohol Use Disorders Identification Test", Guidelines for Use in Primary Care, Second Edition.  World Science writer [facility]). Score between 0-7:  no or low risk or alcohol related problems. Score between 8-15:  moderate risk of alcohol related problems. Score between 16-19:  high risk of alcohol related problems. Score 20 or above:  warrants further diagnostic evaluation for alcohol dependence and treatment.   CLINICAL FACTORS:   Depression:   Comorbid alcohol abuse/dependence Impulsivity Alcohol/Substance  Abuse/Dependencies   Psychiatric Specialty Exam: ROS  Blood pressure 116/77, pulse 75, temperature 97.9 F (36.6 C), temperature source Oral, resp. rate 20, height  (1.803 m), weight 63.504 kg (140 lb), SpO2 100 %.Body mass index is 19.53 kg/(m^2).   COGNITIVE FEATURES THAT CONTRIBUTE TO RISK:  Closed-mindedness, Polarized thinking and Thought constriction (tunnel vision)    SUICIDE RISK:   Moderate:  Frequent suicidal ideation with limited intensity, and duration, some specificity in terms of plans, no associated intent, good self-control, limited dysphoria/symptomatology, some risk factors present, and identifiable protective factors, including available and accessible social support.  PLAN OF CARE: see admission H and P  I certify that inpatient services furnished can reasonably be expected to improve the patient's condition.   Rachael Fee, MD 07/29/2015, 4:44 PM

## 2015-07-29 NOTE — BHH Group Notes (Signed)
[facility] Group Notes:  (Nursing/MHT/Case Management/Adjunct)  Date:  07/29/2015  Time:  4:30 PM  Type of Therapy:  Psychoeducational Skills  Participation Level:  Did Not Attend  Participation Quality:  DID NOT ATTEND  Affect:  DID NOT ATTEND  Cognitive:  DID NOT ATTEND  Insight:  None  Engagement in Group:  DID NOT ATTEND  Modes of Intervention:  DID NOT ATTEND  Summary of Progress/Problems: Pt did not attend patient self inventory group.  Bethann Punches 07/29/2015, 4:30 PM

## 2015-07-29 NOTE — Progress Notes (Signed)
NUTRITION ASSESSMENT  Pt identified as at risk on the Malnutrition Screen Tool  INTERVENTION: 1. Supplements: Continue Ensure Enlive po BID, each supplement provides 350 kcal and 20 grams of protein  NUTRITION DIAGNOSIS: Unintentional weight loss related to sub-optimal intake as evidenced by pt report.   Goal: Pt to meet >/= 90% of their estimated nutrition needs.  Monitor:  PO intake  Assessment:  Pt admitted with depression, SI, and opiate abuse. Pt reporting poor decreased appetite and weight loss via MST score. Per weight history, pt has lost 5 lb since 2/20 (3% wt loss x 1 week, significant for time frame). Patient has been ordered Ensure supplements BID per ONS protocol.   Height: Ht Readings from Last 1 Encounters:  07/28/15  (1.803 m)    Weight: Wt Readings from Last 1 Encounters:  07/28/15 140 lb (63.504 kg)    Weight Hx: Wt Readings from Last 10 Encounters:  07/28/15 140 lb (63.504 kg)  07/27/15 145 lb (65.772 kg)  07/21/15 145 lb (65.772 kg)  01/02/15 145 lb (65.772 kg)  09/13/14 150 lb (68.04 kg)  02/13/14 142 lb 3.2 oz (64.501 kg)  01/22/13 138 lb 9.6 oz (62.869 kg)  11/07/12 135 lb 5 oz (61.377 kg)    BMI:  Body mass index is 19.53 kg/(m^2). Pt meets criteria for normal range based on current BMI.  Estimated Nutritional Needs: Kcal: 25-30 kcal/kg Protein: > 1 gram protein/kg Fluid: 1 ml/kcal  Diet Order: Diet Heart Room service appropriate?: Yes; Fluid consistency:: Thin Pt is also offered choice of unit snacks mid-morning and mid-afternoon.  Pt is eating as desired.   Lab results and medications reviewed.   Tilda Franco, MS, RD, LDN Pager: [phone] After Hours Pager: [phone]

## 2015-07-29 NOTE — BHH Group Notes (Signed)
BHH LCSW Group Therapy  07/29/2015 1:30 PM  Type of Therapy:  Group Therapy  Participation Level:  Did Not Attend-pt invited. Chose to remain in bed.  Summary of Progress/Problems: MHA Speaker came to talk about his personal journey with substance abuse and addiction. The pt processed ways by which to relate to the speaker. MHA speaker provided handouts and educational information pertaining to groups and services offered by the [facility].   Smart, Anthonio Mizzell LCSW 07/29/2015, 1:30 PM

## 2015-07-30 NOTE — Progress Notes (Signed)
DAR NOTE: Pt present with flat affect and depressed mood in the unit. Pt has been isolating himself and has been bed most of the time. Pt denies physical pain, took all his meds as scheduled. As per self inventory, pt had a good night sleep, good appetite, normal energy, and good concentration. Pt rate depression at 07, hopeless ness at 05, and anxiety at 05. Pt's safety ensured with 15 minute and environmental checks. Pt currently denies SI/HI and A/V hallucinations. Pt verbally agrees to seek staff if SI/HI or A/VH occurs and to consult with staff before acting on these thoughts. Will continue POC.

## 2015-07-30 NOTE — BHH Group Notes (Signed)
BHH LCSW Group Therapy  07/30/2015 3:43 PM  Type of Therapy:  Group Therapy  Participation Level:  Minimal  Participation Quality:  Attentive  Affect:  Appropriate  Cognitive:  Alert  Insight:  Improving  Engagement in Therapy:  Improving  Modes of Intervention:  Confrontation, Discussion, Education, Exploration, Problem-solving, Rapport Building, Socialization and Support  Summary of Progress/Problems: Feelings around Diagnosis. Izaah was attentive and actively listened as others shared in the group setting but he did not participate in group discussion. Tarick was called out of group to speak with MD.    Jimmye Norman, Albaro Deviney LCSW 07/30/2015, 3:43 PM

## 2015-07-30 NOTE — Progress Notes (Signed)
Pt declined at [facility]-insurance out of network. Faxed to [employer] with pt consent.  Trula Slade, MSW, LCSW Clinical Social Worker 07/30/2015 2:41 PM

## 2015-07-30 NOTE — Progress Notes (Signed)
Pt attended NA group meeting this evening.   

## 2015-07-30 NOTE — Progress Notes (Signed)
[facility] MD Progress Note  07/30/2015 8:43 PM Mathew Bailey  MRN:  [id number] Subjective:  Mathew Bailey states he is committed to make this work. States he wants to go to a residential treatment program so he can really start dealing with his addiction. He is anticipating that the withdrawal could get worst today, but states he is OK with it as feels once this one is over he will not have to go trough it again Principal Problem: Major depressive disorder, recurrent episode, severe (HCC) Diagnosis:   Patient Active Problem List   Diagnosis Date Noted  . Opioid use disorder, severe, dependence (HCC) [F11.20] 07/28/2015  . Major depressive disorder, recurrent episode, severe (HCC) [F33.2] 07/28/2015  . Cellulitis of foot, right [L03.115] 02/13/2014  . R Plantar Puncture wound [T14.8] 02/13/2014  . Tobacco abuse [Z72.0] 02/13/2014  . Cellulitis [L03.90] 02/13/2014  . Thrombocytopenia, unspecified (HCC) [D69.6] 01/22/2013   Total Time spent with patient: 20 minutes  Past Psychiatric History: see admission H and P  Past Medical History:  Past Medical History  Diagnosis Date  . ADHD (attention deficit hyperactivity disorder)   . Anemia   . Thrombocytopenia (HCC)   . Cellulitis   . Tobacco use   . GERD (gastroesophageal reflux disease)    History reviewed. No pertinent past surgical history. Family History: History reviewed. No pertinent family history. Family Psychiatric  History: see admission H and P Social History:  History  Alcohol Use No     History  Drug Use  . Yes    Comment: [place], Cocaine. Daily use except cocaine is once a month. Street drugs.     Social History   Social History  . Marital Status: Single    Spouse Name: N/A  . Number of Children: N/A  . Years of Education: N/A   Social History Main Topics  . Smoking status: Current Every Day Smoker -- 1.00 packs/day for 10 years    Types: Cigarettes  . Smokeless tobacco: Never Used  . Alcohol Use: No  . Drug Use:  Yes     Comment: [place], Cocaine. Daily use except cocaine is once a month. Street drugs.   . Sexual Activity: Not Asked   Other Topics Concern  . None   Social History Narrative   Additional Social History:                         Sleep: Fair  Appetite:  Fair  Current Medications: Current Facility-Administered Medications  Medication Dose Route Frequency Provider Last Rate Last Dose  . acetaminophen (TYLENOL) tablet 650 mg  650 mg Oral Q6H PRN Charm Rings, NP   650 mg at 07/28/15 1647  . albuterol (PROVENTIL HFA;VENTOLIN HFA) 108 (90 Base) MCG/ACT inhaler 2 puff  2 puff Inhalation Q4H PRN Charm Rings, NP   2 puff at 07/30/15 1839  . alum & mag hydroxide-simeth (MAALOX/MYLANTA) 200-200-20 MG/5ML suspension 30 mL  30 mL Oral Q4H PRN Charm Rings, NP      . cloNIDine (CATAPRES) tablet 0.1 mg  0.1 mg Oral BH-qamhs Charm Rings, NP       Followed by  . [START ON 08/02/2015] cloNIDine (CATAPRES) tablet 0.1 mg  0.1 mg Oral QAC breakfast Charm Rings, NP      . dicyclomine (BENTYL) tablet 20 mg  20 mg Oral Q6H PRN Charm Rings, NP   20 mg at 07/30/15 1341  . feeding supplement (ENSURE ENLIVE) (ENSURE ENLIVE)  liquid 237 mL  237 mL Oral BID BM Rachael Fee, MD   237 mL at 07/30/15 1559  . hydrOXYzine (ATARAX/VISTARIL) tablet 25 mg  25 mg Oral Q6H PRN Charm Rings, NP   25 mg at 07/30/15 1838  . loperamide (IMODIUM) capsule 2-4 mg  2-4 mg Oral PRN Charm Rings, NP      . magnesium hydroxide (MILK OF MAGNESIA) suspension 30 mL  30 mL Oral Daily PRN Charm Rings, NP      . methocarbamol (ROBAXIN) tablet 500 mg  500 mg Oral Q8H PRN Charm Rings, NP   500 mg at 07/30/15 9147  . naproxen (NAPROSYN) tablet 500 mg  500 mg Oral BID PRN Charm Rings, NP   500 mg at 07/29/15 2008  . nicotine (NICODERM CQ - dosed in mg/24 hours) patch 21 mg  21 mg Transdermal Daily Rachael Fee, MD   21 mg at 07/30/15 0756  . ondansetron (ZOFRAN-ODT) disintegrating tablet 4 mg  4 mg  Oral Q6H PRN Charm Rings, NP      . traMADol Janean Sark) tablet 50 mg  50 mg Oral Q6H PRN Rachael Fee, MD   50 mg at 07/29/15 2209  . traZODone (DESYREL) tablet 50 mg  50 mg Oral QHS PRN,MR X 1 Spencer E Simon, PA-C   50 mg at 07/29/15 2304    Lab Results: No results found for this or any previous visit (from the past 48 hour(s)).  Blood Alcohol level:  Lab Results  Component Value Date   ETH <5 07/27/2015   ETH <5 01/02/2015    Physical Findings: AIMS: Facial and Oral Movements Muscles of Facial Expression: None, normal Lips and Perioral Area: None, normal Jaw: None, normal Tongue: None, normal,Extremity Movements Upper (arms, wrists, hands, fingers): None, normal Lower (legs, knees, ankles, toes): None, normal, Trunk Movements Neck, shoulders, hips: None, normal, Overall Severity Severity of abnormal movements (highest score from questions above): None, normal Incapacitation due to abnormal movements: None, normal Patient's awareness of abnormal movements (rate only patient's report): No Awareness, Dental Status Current problems with teeth and/or dentures?: No Does patient usually wear dentures?: No  CIWA:    COWS:  COWS Total Score: 0  Musculoskeletal: Strength & Muscle Tone: within normal limits Gait & Station: normal Patient leans: normal  Psychiatric Specialty Exam: Review of Systems  Constitutional: Negative.   HENT: Negative.   Eyes: Negative.   Respiratory: Negative.   Cardiovascular: Negative.   Gastrointestinal: Negative.   Genitourinary: Negative.   Musculoskeletal: Positive for myalgias and back pain.  Skin: Negative.   Neurological: Negative.   Endo/Heme/Allergies: Negative.   Psychiatric/Behavioral: Positive for depression and substance abuse. The patient is nervous/anxious.     Blood pressure 125/61, pulse 79, temperature 97.6 F (36.4 C), temperature source Oral, resp. rate 16, height  (1.803 m), weight 63.504 kg (140 lb), SpO2 100 %.Body  mass index is 19.53 kg/(m^2).  General Appearance: Fairly Groomed  Patent attorney::  Fair  Speech:  Clear and Coherent  Volume:  Decreased  Mood:  Anxious and worried  Affect:  Restricted  Thought Process:  Coherent and Goal Directed  Orientation:  Full (Time, Place, and Person)  Thought Content:  symptoms events worries concerns  Suicidal Thoughts:  No  Homicidal Thoughts:  No  Memory:  Immediate;   Fair Recent;   Fair Remote;   Fair  Judgement:  Fair  Insight:  Present  Psychomotor Activity:  Restlessness  Concentration:  Fair  Recall:  [employer] of Knowledge:Fair  Language: Fair  Akathisia:  No  Handed:  Right  AIMS (if indicated):     Assets:  Desire for Improvement  ADL's:  Intact  Cognition: WNL  Sleep:  Number of Hours: 6   Treatment Plan Summary: Daily contact with patient to assess and evaluate symptoms and progress in treatment and Medication management  Supportive approach/coping skills Opioid dependence; continue the clonidine detox protocol/work a relapse prevention plan Depression; will reassess for the need for an antidepressant. He would want to wait before starting one Use CBT/mindfulness Explore residential treatment options  Hasina Kreager A, MD 07/30/2015, 8:43 PM

## 2015-07-30 NOTE — BHH Group Notes (Signed)
[facility] LCSW Aftercare Discharge Planning Group Note   07/30/2015 10:23 AM  Participation Quality:  Minimal  Mood/Affect:  Lethargic  Depression Rating:  0  Anxiety Rating:  5  Thoughts of Suicide:  No Will you contract for safety?   NA  Current AVH:  No  Plan for Discharge/Comments:  Pt reports that he is hoping for referral to [facility] (sent on 2/28). Pt reports feeling groggy and sleepy this morning. Pt denies any other concerns other than nightmares last night. MD made aware.   Transportation Means: family  Supports: Land, Conservation officer, nature

## 2015-07-30 NOTE — Progress Notes (Signed)
Recreation Therapy Notes  Date: 03.01.2017 Time: 9:30am Location: [address] Group Room   Group Topic: Stress Management  Goal Area(s) Addresses:  Patient will actively participate in stress management techniques presented during session.   Behavioral Response: Appropriate   Intervention: Stress management techniques  Activity :  Diaphragmatic Breathing and Mindfulness. LRT provided education, instruction and demonstration on practice of Diaphragmatic Breathing and Mindfulness. Patient was asked to participate in technique introduced during session.   Education:  Stress Management, Discharge Planning.   Education Outcome: Acknowledges education/In group clarification offered/Needs additional education  Clinical Observations/Feedback: Patient actively engaged in technique introduced, expressed no concerns and demonstrated ability to practice independently post d/c.   Kerrick Miler L Lavanya Roa, LRT/CTRS        Letita Prentiss L 07/30/2015 11:50 AM 

## 2015-07-31 MED ORDER — DOCUSATE SODIUM 100 MG PO CAPS
100.0000 mg | ORAL_CAPSULE | Freq: Two times a day (BID) | ORAL | Status: DC
  Administered 2015-07-31: 100 mg via ORAL
  Filled 2015-07-31 (×8): qty 1

## 2015-07-31 MED ORDER — DOCUSATE SODIUM 100 MG PO CAPS
100.0000 mg | ORAL_CAPSULE | Freq: Two times a day (BID) | ORAL | Status: DC
  Filled 2015-07-31 (×2): qty 1

## 2015-07-31 MED ORDER — POLYETHYLENE GLYCOL 3350 17 G PO PACK
17.0000 g | PACK | Freq: Every day | ORAL | Status: DC
  Administered 2015-07-31 – 2015-08-01 (×2): 17 g via ORAL
  Filled 2015-07-31 (×5): qty 1

## 2015-07-31 NOTE — Progress Notes (Signed)
D. Pt had been up and visible in milieu, did attend evening group activity. Pt spoke of on-going withdrawal issues and reported generalized achiness, anxiety and irritability but reports that he feels he is improving and spoke about his need to remain drug free and go to a treatment facility after discharge to keep working on his sobriety. Pt did request and receive medications to help withdrawal symptoms. A. Support provided, medication education given. R. Pt verbalized understanding, safety maintained.

## 2015-07-31 NOTE — BHH Group Notes (Signed)
B[facility] LCSW Group Therapy  07/31/2015 12:43 PM  Type of Therapy:  Group Therapy  Participation Level:  Minimal  Participation Quality:  Attentive  Affect:  Appropriate  Cognitive:  Alert  Insight:  Improving  Engagement in Therapy:  Improving  Modes of Intervention:  Confrontation, Discussion, Education, Exploration, Problem-solving, Rapport Building, Socialization and Support  Summary of Progress/Problems: Emotion Regulation: This group focused on both positive and negative emotion identification and allowed group members to process ways to identify feelings, regulate negative emotions, and find healthy ways to manage internal/external emotions. Group members were asked to reflect on a time when their reaction to an emotion led to a negative outcome and explored how alternative responses using emotion regulation would have benefited them. Group members were also asked to discuss a time when emotion regulation was utilized when a negative emotion was experienced. Mathew Bailey was attentive during today's processing group but he did not actively participate in group discussion. Mathew Bailey stated that he was probably getting into [facility] tomorrow and was excited about that.   Smart, Chinaza Rooke LCSW 07/31/2015, 12:43 PM

## 2015-07-31 NOTE — BHH Group Notes (Signed)
Patient was invited to attend nursing education group however elected to remain in bed.  

## 2015-07-31 NOTE — Progress Notes (Signed)
Found patient lying in bed asleep this morning. Promptly came up for AM meds. Affect flat, mood anxious and depressed. Complaining of back pain (6-7/10) and abdominal cramping due to opiate withdrawal. Rates his depression at a 5/10, hopelessness at a 3/10 and anxiety at a 4/10. Reports his goal is to "stay positive and keep the right mindset." Patient medicated per orders. Emotional support given. Self inventory reviewed. He denies SI/HI and remains safe on level III obs. Mathew Bailey

## 2015-07-31 NOTE — Plan of Care (Signed)
Problem: Ineffective individual coping Goal: STG: Patient will remain free from self harm Outcome: Progressing Patient has not engaged in self harm and denies SI     Problem: Alteration in mood & ability to function due to Goal: STG-Patient will comply with prescribed medication regimen (Patient will comply with prescribed medication regimen)  Outcome: Progressing Patient is med compliant.     

## 2015-07-31 NOTE — Progress Notes (Signed)
[facility] Group Notes:  (Nursing/MHT/Case Management/Adjunct)  Date:  07/31/2015  Time:  2100  Type of Therapy:  wrap up group  Participation Level:  Active  Participation Quality:  Appropriate, Attentive, Sharing and Supportive  Affect:  Flat  Cognitive:  Appropriate  Insight:  Improving  Engagement in Group:  Engaged  Modes of Intervention:  Clarification, Education and Support  Summary of Progress/Problems: Pt is unclear what his discharge plans will look like. Pt wants to attend treatment in [place] depending on if grandma will pay the $2000. Floyd[facility] Flock is also an option but pt reports he would be here through the weekend until a bed opened up Monday - Thursday. Pt shared that he surprisingly had a good day with less depression and is trying to take a little medication as he deals with his  withdrawal symptoms.   Marcille Buffy 07/31/2015, 10:12 PM

## 2015-07-31 NOTE — Progress Notes (Signed)
[facility] MD Progress Note  07/31/2015 5:55 PM Mathew Bailey  MRN:  [id number] Subjective:  Mathew Bailey states that he is excited and looking forward to go to rehab. States he called his mother to notify her and says that although she usually does not express a lot of emotion she was almost crying on the phone. States this is it for him. He wants to get "clean" and beat this. He  apologized to his mother for all that he has put her trough.  Principal Problem: Major depressive disorder, recurrent episode, severe (HCC) Diagnosis:   Patient Active Problem List   Diagnosis Date Noted  . Opioid use disorder, severe, dependence (HCC) [F11.20] 07/28/2015  . Major depressive disorder, recurrent episode, severe (HCC) [F33.2] 07/28/2015  . Cellulitis of foot, right [L03.115] 02/13/2014  . R Plantar Puncture wound [T14.8] 02/13/2014  . Tobacco abuse [Z72.0] 02/13/2014  . Cellulitis [L03.90] 02/13/2014  . Thrombocytopenia, unspecified (HCC) [D69.6] 01/22/2013   Total Time spent with patient: 20 minutes  Past Psychiatric History: see admission H and P  Past Medical History:  Past Medical History  Diagnosis Date  . ADHD (attention deficit hyperactivity disorder)   . Anemia   . Thrombocytopenia (HCC)   . Cellulitis   . Tobacco use   . GERD (gastroesophageal reflux disease)    History reviewed. No pertinent past surgical history. Family History: History reviewed. No pertinent family history. Family Psychiatric  History: see admission H and P Social History:  History  Alcohol Use No     History  Drug Use  . Yes    Comment: [place], Cocaine. Daily use except cocaine is once a month. Street drugs.     Social History   Social History  . Marital Status: Single    Spouse Name: N/A  . Number of Children: N/A  . Years of Education: N/A   Social History Main Topics  . Smoking status: Current Every Day Smoker -- 1.00 packs/day for 10 years    Types: Cigarettes  . Smokeless tobacco: Never Used  .  Alcohol Use: No  . Drug Use: Yes     Comment: [place], Cocaine. Daily use except cocaine is once a month. Street drugs.   . Sexual Activity: Not Asked   Other Topics Concern  . None   Social History Narrative   Additional Social History:                         Sleep: Fair  Appetite:  Fair  Current Medications: Current Facility-Administered Medications  Medication Dose Route Frequency Provider Last Rate Last Dose  . acetaminophen (TYLENOL) tablet 650 mg  650 mg Oral Q6H PRN Charm Rings, NP   650 mg at 07/28/15 1647  . albuterol (PROVENTIL HFA;VENTOLIN HFA) 108 (90 Base) MCG/ACT inhaler 2 puff  2 puff Inhalation Q4H PRN Charm Rings, NP   2 puff at 07/31/15 1254  . alum & mag hydroxide-simeth (MAALOX/MYLANTA) 200-200-20 MG/5ML suspension 30 mL  30 mL Oral Q4H PRN Charm Rings, NP      . cloNIDine (CATAPRES) tablet 0.1 mg  0.1 mg Oral BH-qamhs Charm Rings, NP   0.1 mg at 07/31/15 4098   Followed by  . [START ON 08/02/2015] cloNIDine (CATAPRES) tablet 0.1 mg  0.1 mg Oral QAC breakfast Charm Rings, NP      . dicyclomine (BENTYL) tablet 20 mg  20 mg Oral Q6H PRN Charm Rings, NP   20  mg at 07/31/15 0813  . docusate sodium (COLACE) capsule 100 mg  100 mg Oral BID Rachael Fee, MD   100 mg at 07/31/15 1607  . feeding supplement (ENSURE ENLIVE) (ENSURE ENLIVE) liquid 237 mL  237 mL Oral BID BM Rachael Fee, MD   237 mL at 07/30/15 1559  . hydrOXYzine (ATARAX/VISTARIL) tablet 25 mg  25 mg Oral Q6H PRN Charm Rings, NP   25 mg at 07/30/15 1838  . loperamide (IMODIUM) capsule 2-4 mg  2-4 mg Oral PRN Charm Rings, NP      . magnesium hydroxide (MILK OF MAGNESIA) suspension 30 mL  30 mL Oral Daily PRN Charm Rings, NP      . methocarbamol (ROBAXIN) tablet 500 mg  500 mg Oral Q8H PRN Charm Rings, NP   500 mg at 07/30/15 1610  . naproxen (NAPROSYN) tablet 500 mg  500 mg Oral BID PRN Charm Rings, NP   500 mg at 07/31/15 0813  . nicotine (NICODERM CQ - dosed in  mg/24 hours) patch 21 mg  21 mg Transdermal Daily Rachael Fee, MD   21 mg at 07/31/15 [phone]  . ondansetron (ZOFRAN-ODT) disintegrating tablet 4 mg  4 mg Oral Q6H PRN Charm Rings, NP      . polyethylene glycol (MIRALAX / GLYCOLAX) packet 17 g  17 g Oral Daily Rachael Fee, MD   17 g at 07/31/15 1607  . traMADol (ULTRAM) tablet 50 mg  50 mg Oral Q6H PRN Rachael Fee, MD   50 mg at 07/30/15 2124  . traZODone (DESYREL) tablet 50 mg  50 mg Oral QHS PRN,MR X 1 Kerry Hough, PA-C   50 mg at 07/30/15 2124    Lab Results: No results found for this or any previous visit (from the past 48 hour(s)).  Blood Alcohol level:  Lab Results  Component Value Date   ETH <5 07/27/2015   ETH <5 01/02/2015    Physical Findings: AIMS: Facial and Oral Movements Muscles of Facial Expression: None, normal Lips and Perioral Area: None, normal Jaw: None, normal Tongue: None, normal,Extremity Movements Upper (arms, wrists, hands, fingers): None, normal Lower (legs, knees, ankles, toes): None, normal, Trunk Movements Neck, shoulders, hips: None, normal, Overall Severity Severity of abnormal movements (highest score from questions above): None, normal Incapacitation due to abnormal movements: None, normal Patient's awareness of abnormal movements (rate only patient's report): No Awareness, Dental Status Current problems with teeth and/or dentures?: No Does patient usually wear dentures?: No  CIWA:    COWS:  COWS Total Score: 0  Musculoskeletal: Strength & Muscle Tone: within normal limits Gait & Station: normal Patient leans: normal  Psychiatric Specialty Exam: Review of Systems  Constitutional: Negative.   HENT: Negative.   Eyes: Negative.   Respiratory: Negative.   Cardiovascular: Negative.   Gastrointestinal: Positive for constipation.  Genitourinary: Negative.   Musculoskeletal: Negative.   Skin: Negative.   Neurological: Negative.   Endo/Heme/Allergies: Negative.    Psychiatric/Behavioral: Positive for substance abuse. The patient is nervous/anxious.     Blood pressure 117/61, pulse 72, temperature 97.6 F (36.4 C), temperature source Oral, resp. rate 16, height  (1.803 m), weight 63.504 kg (140 lb), SpO2 100 %.Body mass index is 19.53 kg/(m^2).  General Appearance: Fairly Groomed  Patent attorney::  Fair  Speech:  Clear and Coherent  Volume:  Normal  Mood:  Euthymic  Affect:  Appropriate  Thought Process:  Coherent and Goal Directed  Orientation:  Full (Time, Place, and Person)  Thought Content:  symptoms events worries concerns  Suicidal Thoughts:  No  Homicidal Thoughts:  No  Memory:  Immediate;   Fair Recent;   Fair Remote;   Fair  Judgement:  Fair  Insight:  Present  Psychomotor Activity:  Normal  Concentration:  Fair  Recall:  [employer] of Knowledge:Fair  Language: Fair  Akathisia:  No  Handed:  Right  AIMS (if indicated):     Assets:  Desire for Improvement Social Support  ADL's:  Intact  Cognition: WNL  Sleep:  Number of Hours: 6   Treatment Plan Summary: Daily contact with patient to assess and evaluate symptoms and progress in treatment and Medication management Supportive approach/coping skills Opioid dependence: continue the clonidine detox protocol/work a relapse prevention plan Depression; will hold the use of an antidepressant at this time as seems that there is a drug induced component to his mood Work with CBT/mindfulness Olamae Ferrara A, MD 07/31/2015, 5:55 PM

## 2015-08-01 DIAGNOSIS — F332 Major depressive disorder, recurrent severe without psychotic features: Secondary | ICD-10-CM | POA: Insufficient documentation

## 2015-08-01 MED ORDER — POLYETHYLENE GLYCOL 3350 17 G PO PACK: 17.0000 g | PACK | Freq: Every day | ORAL | Status: DC

## 2015-08-01 MED ORDER — DOCUSATE SODIUM 100 MG PO CAPS: 100.0000 mg | ORAL_CAPSULE | Freq: Two times a day (BID) | ORAL | Status: DC

## 2015-08-01 MED ORDER — TRAZODONE HCL 50 MG PO TABS: 50.0000 mg | ORAL_TABLET | Freq: Every evening | ORAL | Status: DC | PRN

## 2015-08-01 MED ORDER — ALBUTEROL SULFATE HFA 108 (90 BASE) MCG/ACT IN AERS: 2.0000 | INHALATION_SPRAY | RESPIRATORY_TRACT | Status: DC | PRN

## 2015-08-01 MED ORDER — HYDROXYZINE HCL 25 MG PO TABS: 25.0000 mg | ORAL_TABLET | Freq: Four times a day (QID) | ORAL | Status: DC | PRN

## 2015-08-01 MED ORDER — NICOTINE 21 MG/24HR TD PT24: 21.0000 mg | MEDICATED_PATCH | Freq: Every day | TRANSDERMAL | Status: DC

## 2015-08-01 NOTE — Progress Notes (Signed)
D: Pt has anxious affect and depressed mood.  He reports his day was "good" and that he has been "taking medications like I'm supposed to."  Pt reports he attended groups today and "learned a lot."  He reports that he may be discharging tomorrow.  Pt reports he would like to go to a treatment facility in [place] or to [place].  He reports that he feels safe to discharge tomorrow.  Pt denies SI/HI, denies hallucinations.  Pt has been visible in milieu and he attended evening group.   A: Introduced self to pt.  Met with pt 1:1 and provided support and encouragement.  Actively listened to pt.  Fall prevention techniques reviewed with pt and pt verbalized understanding.  PO fluids encouraged and provided.  Medications administered per order.  PRN medication administered for pain, sleep, shortness of breath, and muscle spasms.  He continued to report pain upon reassessment and was offered PRN Tylenol.  Pt reported he did not want to take PRN Tylenol at the time and that he would inform writer if he changed his mind.  He went to his room shortly after and fell asleep.  Respirations are even and unlabored and pt does not appear to be in any distress.   R: Pt is compliant with medications.  Pt verbally contracts for safety.  Will continue to monitor and assess.

## 2015-08-01 NOTE — Discharge Summary (Signed)
Physician Discharge Summary Note  Patient:  Mathew Bailey is an 25 y.o., male MRN:  [id number] DOB:  01/20/1991 Patient phone:  [phone] (home)  Patient address:   [address] [place] [address],  Total Time spent with patient: Greater than 30 minutes  Date of Admission:  07/28/2015  Date of Discharge: 08-01-15  Reason for Admission: Opioid detox  Principal Problem: Major depressive disorder, recurrent episode, severe [facility](HCC)  Discharge Diagnoses: Patient Active Problem List   Diagnosis Date Noted  . Severe episode of recurrent major depressive disorder, without psychotic features (HCC) [F33.2]   . Opioid use disorder, severe, dependence (HCC) [F11.20] 07/28/2015  . Major depressive disorder, recurrent episode, severe (HCC) [F33.2] 07/28/2015  . Cellulitis of foot, right [L03.115] 02/13/2014  . R Plantar Puncture wound [T14.8] 02/13/2014  . Tobacco abuse [Z72.0] 02/13/2014  . Cellulitis [L03.90] 02/13/2014  . Thrombocytopenia, unspecified (HCC) [D69.6] 01/22/2013   Past Psychiatric History: Opioid use disorder  Past Medical History:  Past Medical History  Diagnosis Date  . ADHD (attention deficit hyperactivity disorder)   . Anemia   . Thrombocytopenia (HCC)   . Cellulitis   . Tobacco use   . GERD (gastroesophageal reflux disease)    History reviewed. No pertinent past surgical history. Family History: History reviewed. No pertinent family history.  Family Psychiatric  History: See H&P  Social History:  History  Alcohol Use No     History  Drug Use  . Yes    Comment: Mathew Bailey, Cocaine. Daily use except cocaine is once a month. Street drugs.     Social History   Social History  . Marital Status: Single    Spouse Name: N/A  . Number of Children: N/A  . Years of Education: N/A   Social History Main Topics  . Smoking status: Current Every Day Smoker -- 1.00 packs/day for 10 years    Types: Cigarettes  . Smokeless tobacco: Never Used  .  Alcohol Use: No  . Drug Use: Yes     Comment: Mathew Bailey, Cocaine. Daily use except cocaine is once a month. Street drugs.   . Sexual Activity: Not Asked   Other Topics Concern  . None   Social History Narrative   Hospital Course: 25 Y/O male who states he feels he is " Bipolar." States he might be doing OK and then he experiences changes in his mood. The changes are not every time triggered. States that right now he is coming off the opioids. He has been using any type of opioid he can get except heroin: Oxys fentanyl. From Thursday until he came to the hospital he was getting Suboxone off the street to help the withdrawal.. States he was taking half a Suboxone. States he started experiencing with opioids whenhe was 17. When he was [age] the opioid addiction got worst. His grandfather died of liver and kidney cancer. Brother was Dx with Lymphoma. Coming here he is dealing with losing a long term relationship,his family, his house. States 2 weeks ago GF broke up with him. States his GF was "his rock" depression this time around was triggered by losing her.  [facility]athe[facility] with his UDS test reports positive of THC. However, his main reason for admission was to receive opioid detoxification treatments as he has been abusing & addicted to opioid-type drugs. He stated on admission that he was using any kind of opiates he can get his hands on. He was also presenting with some  symptoms of depression triggered by familial & relationship stressors. After admission assessment/evaluation, Mathew Bailey was started on Clonidine detoxification treatment protocols for opioid detox. He was enrolled & participated in the group counseling sessions, AA/NA meeting being offered & held on this unit. He learned coping skills that should him after discharge to cope better & maintain sobriety/mood stability.   During the course of his hospital stay, Mathew Bailey also was medicated & discharged on  Hydroxyzine 25 mg prn for anxiety & Trazodone 50 mg for insomnia. He received other medication regimen for the other medical ailments that he presented. He tolerated his treatment regimen without any adverse effects or reactions reported.  Mathew Bailey has completed detox treatment & his mood is stable. This evidenced by his reports of improved symptoms & presentation of good affects. He is currently being discharged to continue further substance treatment as noted below. Upon discharge, he was both mentally & medically stable for discharge. He currently denies any suicidal, homicidal ideations, auditory, visual/tactile hallucinations, delusional thoughts, paranoia or substance withdrawal symptoms. He was provided with a 14 days worth, supply samples of his [facility] discharge medications. Mathew Bailey left [facility] with all personal belongings in no apparent distress. Transportation per father.       Physical Findings: AIMS: Facial and Oral Movements Muscles of Facial Expression: None, normal Lips and Perioral Area: None, normal Jaw: None, normal Tongue: None, normal,Extremity Movements Upper (arms, wrists, hands, fingers): None, normal Lower (legs, knees, ankles, toes): None, normal, Trunk Movements Neck, shoulders, hips: None, normal, Overall Severity Severity of abnormal movements (highest score from questions above): None, normal Incapacitation due to abnormal movements: None, normal Patient's awareness of abnormal movements (rate only patient's report): No Awareness, Dental Status Current problems with teeth and/or dentures?: No Does patient usually wear dentures?: No  CIWA:    COWS:  COWS Total Score: 0  Musculoskeletal: Strength & Muscle Tone: within normal limits Gait & Station: normal Patient leans: N/A  Psychiatric Specialty Exam: Review of Systems  Constitutional: Negative.   HENT: Negative.   Eyes: Negative.   Respiratory: Negative.   Cardiovascular: Negative.   Gastrointestinal: Negative.    Genitourinary: Negative.   Musculoskeletal: Negative.   Skin: Negative.   Neurological: Negative.   Endo/Heme/Allergies: Negative.   Psychiatric/Behavioral: Positive for substance abuse (Opioid dependence). Negative for depression, suicidal ideas, hallucinations and memory loss. The patient has insomnia (Stable). The patient is not nervous/anxious.     Blood pressure 148/79, pulse 91, temperature 97.4 F (36.3 C), temperature source Oral, resp. rate 16, height  (1.803 m), weight 63.504 kg (140 lb), SpO2 100 %.Body mass index is 19.53 kg/(m^2).  See Md's SRA   Have you used any form of tobacco in the last 30 days? (Cigarettes, Smokeless Tobacco, Cigars, and/or Pipes): Yes  Has this patient used any form of tobacco in the last 30 days? (Cigarettes, Smokeless Tobacco, Cigars, and/or Pipes): Yes, provided with nicotine patch prescription for smoking cessation.  Blood Alcohol level:  Lab Results  Component Value Date   ETH <5 07/27/2015   ETH <5 01/02/2015    Metabolic Disorder Labs:  No results found for: HGBA1C, MPG No results found for: PROLACTIN No results found for: CHOL, TRIG, HDL, CHOLHDL, VLDL, LDLCALC  See Psychiatric Specialty Exam and Suicide Risk Assessment completed by Attending Physician prior to discharge.  Discharge destination:  Home  Is patient on multiple antipsychotic therapies at discharge:  No   Has Patient had three or more failed trials of antipsychotic monotherapy by history:  No  Recommended Plan for Multiple Antipsychotic Therapies: NA    Medication List    TAKE these medications      Indication   albuterol 108 (90 Base) MCG/ACT inhaler  Commonly known as:  PROVENTIL HFA;VENTOLIN HFA  Inhale 2 puffs into the lungs every 4 (four) hours as needed for wheezing or shortness of breath.   Indication:  Asthma     docusate sodium 100 MG capsule  Commonly known as:  COLACE  Take 1 capsule (100 mg total) by mouth 2 (two) times daily. (May purchase  from over the counter at the pharmacy): For Constipation   Indication:  Constipation     hydrOXYzine 25 MG tablet  Commonly known as:  ATARAX/VISTARIL  Take 1 tablet (25 mg total) by mouth every 6 (six) hours as needed for anxiety.   Indication:  Anxiety     nicotine 21 mg/24hr patch  Commonly known as:  NICODERM CQ - dosed in mg/24 hours  Place 1 patch (21 mg total) onto the skin daily. For smoking cessation   Indication:  Nicotine Addiction     polyethylene glycol packet  Commonly known as:  MIRALAX / GLYCOLAX  Take 17 g by mouth daily. (May purchase from the over the counter at yr pharmacy): For constipation   Indication:  Constipation     traZODone 50 MG tablet  Commonly known as:  DESYREL  Take 1 tablet (50 mg total) by mouth at bedtime as needed and may repeat dose one time if needed for sleep.   Indication:  Trouble Sleeping       Follow-up Information    Follow up with [facility] On 08/01/2015.   Why:  Referral faxed: 07/30/15. You have been accepted for admission today per [facility] in Admissions.    Contact information:   [address] [place], [place] [address] Phone: [phone] Fax: [phone]     Follow-up recommendations: Activity:  As tolerated Diet: As recommended by your primary care doctor. Keep all scheduled follow-up appointments as recommended.  Comments: Take all your medications as prescribed by your mental healthcare provider. Report any adverse effects and or reactions from your medicines to your outpatient provider promptly. Patient is instructed and cautioned to not engage in alcohol and or illegal drug use while on prescription medicines. In the event of worsening symptoms, patient is instructed to call the crisis hotline, 911 and or go to the nearest ED for appropriate evaluation and treatment of symptoms. Follow-up with your primary care provider for your other medical issues, concerns and or health care needs.   Signed: Sanjuana Kava, NP, PMHNP, FNP-BC 08/01/2015, 1:31 PM  I personally assessed the patient and formulated the plan Madie Reno A. Dub Mikes, M.D.

## 2015-08-01 NOTE — Progress Notes (Signed)
Pt discharged home with his father. Pt was ambulatory, stable and appreciative at that time. All papers and prescriptions were given and valuables returned. Verbal understanding expressed. Denies SI/HI and A/VH. Pt given opportunity to express concerns and ask questions. 

## 2015-08-01 NOTE — Progress Notes (Signed)
  [facility] :  Will you be returning to the same living situation after discharge:  Yes,  [facility] At discharge, do you have transportation home?: Yes,  father Do you have the ability to pay for your medications: Yes,  [employer]  Release of information consent forms completed and submitted to medical records by CSW. Patient to Follow up at: Follow-up Information    Follow up with [facility] [facility] On 08/01/2015.   Why:  Referral faxed: 07/30/15. [facility].    Contact information:   [address] [place], [place] [address] Phone: [phone] Fax: [phone]      Next level of care provider has access to [facility] Link:no  Safety Planning and Suicide Prevention discussed: Yes,  SPE completed with both pt and his father. Pt provided with SPI pamphlet and mobile crisis information   Have you used any form of tobacco in the last 30 days? (Cigarettes, Smokeless Tobacco, Cigars, and/or Pipes): Yes  Has patient been referred to the Quitline?: Patient refused referral  Patient has been referred for addiction treatment: Yes-see above.   Smart, Nazirah Tri LCSW 08/01/2015, 10:08 AM

## 2015-08-01 NOTE — BHH Suicide Risk Assessment (Signed)
[facility]   Principal Problem: Major depressive disorder, recurrent episode, severe (HCC) Discharge Diagnoses:  Patient Active Problem List   Diagnosis Date Noted  . Opioid use disorder, severe, dependence (HCC) [F11.20] 07/28/2015  . Major depressive disorder, recurrent episode, severe (HCC) [F33.2] 07/28/2015  . Cellulitis of foot, right [L03.115] 02/13/2014  . R Plantar Puncture wound [T14.8] 02/13/2014  . Tobacco abuse [Z72.0] 02/13/2014  . Cellulitis [L03.90] 02/13/2014  . Thrombocytopenia, unspecified (HCC) [D69.6] 01/22/2013    Total Time spent with patient: 20 minutes  Musculoskeletal: Strength & Muscle Tone: within normal limits Gait & Station: normal Patient leans: normal  Psychiatric Specialty Exam: Review of Systems  Constitutional: Negative.   HENT: Negative.   Eyes: Negative.   Respiratory: Negative.   Cardiovascular: Negative.   Gastrointestinal: Negative.   Genitourinary: Negative.   Musculoskeletal: Negative.   Skin: Negative.   Neurological: Negative.   Endo/Heme/Allergies: Negative.   Psychiatric/Behavioral: Positive for substance abuse.    Blood pressure 148/79, pulse 91, temperature 97.4 F (36.3 C), temperature source Oral, resp. rate 16, height 5\' 11"  (1.803 m), weight 63.504 kg (140 lb), SpO2 100 %.Body mass index is 19.53 kg/(m^2).  General Appearance: Fairly Groomed  Patent attorneyye Contact::  Fair  Speech:  Clear and Coherent409  Volume:  Normal  Mood:  Euthymic  Affect:  Appropriate  Thought Process:  Coherent and Goal Directed  Orientation:  Full (Time, Place, and Person)  Thought Content:  plans as he moves on, relapse prevention plan  Suicidal Thoughts:  No  Homicidal Thoughts:  No  Memory:  Immediate;   Fair Recent;   Fair Remote;   Fair  Judgement:  Fair  Insight:  Present  Psychomotor Activity:  Normal  Concentration:  Fair  Recall:  FiservFair  Fund of Knowledge:Fair  Language: Fair  Akathisia:  No  Handed:   Right  AIMS (if indicated):     Assets:  Desire for Improvement Housing Social Support  Sleep:  Number of Hours: 6.5  Cognition: WNL  ADL's:  Intact  In full contact with reality. There are no active S/S of withdrawal. There are no active SI plans or intent. He is looking forward to pursue residential [facility] Mental Status Per Nursing Assessment::   On Admission:  Suicidal ideation indicated by patient, Suicide plan, Plan includes specific time, place, or method  Demographic Factors:  Adolescent or young adult and Caucasian  Loss Factors: Loss of significant relationship  Historical Factors: none identified  Risk Reduction Factors:   Sense of responsibility to family and Positive social support  Continued Clinical Symptoms:  Depression:   Comorbid alcohol abuse/dependence Alcohol/Substance Abuse/Dependencies  Cognitive Features That Contribute To Risk:  None    Suicide Risk:  Minimal: No identifiable suicidal ideation.  Patients presenting with no risk factors but with morbid ruminations; may be classified as minimal risk based on the severity of the depressive symptoms  Follow-up Information    [facility] On 08/01/2015.   Why:  Referral faxed: 07/30/15. You have been accepted for admission today [facility].    Contact information:   [address] [place], [place] [address] Phone: [phone] Fax: [phone]      Plan Of Care/Follow-up recommendations:  Activity:  as tolerated Diet:  regular  Follow up as above  Freida Nebel A, MD 08/01/2015, 12:18 PM
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
## 2015-08-01 NOTE — BHH Suicide Risk Assessment (Signed)
B[facility] INPATIENT:  Family/Significant Other Suicide Prevention Education  Suicide Prevention Education:  Education Completed; Mr. Andria MeuseStevens (pt's father) [phone] has been identified by the patient as the family member/significant other with whom the patient will be residing, and identified as the person(s) who will aid the patient in the event of a mental health crisis (suicidal ideations/suicide attempt).  With written consent from the patient, the family member/significant other has been provided the following suicide prevention education, prior to the and/or following the discharge of the patient.  The suicide prevention education provided includes the following:  Suicide risk factors  Suicide prevention and interventions  National Suicide Hotline telephone number  [facility] assessment telephone number  [facility] Emergency Assistance 911  [facility] and/or Residential Mobile Crisis Unit telephone number  Request made of family/significant other to:  Remove weapons (e.g., guns, rifles, knives), all items previously/currently identified as safety concern.    Remove drugs/medications (over-the-counter, prescriptions, illicit drugs), all items previously/currently identified as a safety concern.  The family member/significant other verbalizes understanding of the suicide prevention education information provided.  The family member/significant other agrees to remove the items of safety concern listed above.  Smart, Lesha Jager LCSW 08/01/2015, 9:57 AM

## 2015-08-01 NOTE — Progress Notes (Signed)
Recreation Therapy Notes  Date: 03.03.2017 Time: 9:30am Location: [address] Dayroom   Group Topic: Stress Management  Goal Area(s) Addresses:  Patient will actively participate in stress management techniques presented during session.   Behavioral Response: Did not attend.   Marykay Lexenise L Arun Herrod, LRT/CTRS  Jaydeen Odor L 08/01/2015 10:18 AM

## 2015-08-01 NOTE — Plan of Care (Signed)
Problem: Diagnosis: Increased Risk For Suicide Attempt Goal: STG-Patient Will Attend All Groups On The Unit Outcome: Progressing Pt attended evening group on 07/31/15

## 2016-06-22 ENCOUNTER — Emergency Department (HOSPITAL_COMMUNITY): Payer: Self-pay

## 2016-06-22 ENCOUNTER — Emergency Department (HOSPITAL_COMMUNITY)
Admission: EM | Admit: 2016-06-22 | Discharge: 2016-06-24 | Disposition: A | Discharge status: Home / Self Care | Payer: PRIVATE HEALTH INSURANCE

## 2016-06-22 ENCOUNTER — Encounter (HOSPITAL_COMMUNITY): Payer: Self-pay | Admitting: *Deleted

## 2016-06-22 DIAGNOSIS — Z79899 Other long term (current) drug therapy: Secondary | ICD-10-CM | POA: Insufficient documentation

## 2016-06-22 DIAGNOSIS — F1320 Sedative, hypnotic or anxiolytic dependence, uncomplicated: Secondary | ICD-10-CM | POA: Insufficient documentation

## 2016-06-22 DIAGNOSIS — F1324 Sedative, hypnotic or anxiolytic dependence with sedative, hypnotic or anxiolytic-induced mood disorder: Secondary | ICD-10-CM | POA: Insufficient documentation

## 2016-06-22 DIAGNOSIS — R7401 Elevation of levels of liver transaminase levels: Secondary | ICD-10-CM

## 2016-06-22 DIAGNOSIS — F1120 Opioid dependence, uncomplicated: Secondary | ICD-10-CM | POA: Diagnosis present

## 2016-06-22 DIAGNOSIS — Z91040 Latex allergy status: Secondary | ICD-10-CM | POA: Insufficient documentation

## 2016-06-22 DIAGNOSIS — F332 Major depressive disorder, recurrent severe without psychotic features: Secondary | ICD-10-CM | POA: Diagnosis present

## 2016-06-22 DIAGNOSIS — F1994 Other psychoactive substance use, unspecified with psychoactive substance-induced mood disorder: Secondary | ICD-10-CM | POA: Diagnosis present

## 2016-06-22 DIAGNOSIS — R740 Nonspecific elevation of levels of transaminase and lactic acid dehydrogenase [LDH]: Secondary | ICD-10-CM | POA: Insufficient documentation

## 2016-06-22 DIAGNOSIS — F17210 Nicotine dependence, cigarettes, uncomplicated: Secondary | ICD-10-CM | POA: Insufficient documentation

## 2016-06-22 DIAGNOSIS — F1220 Cannabis dependence, uncomplicated: Secondary | ICD-10-CM | POA: Diagnosis not present

## 2016-06-22 DIAGNOSIS — R45851 Suicidal ideations: Secondary | ICD-10-CM

## 2016-06-22 DIAGNOSIS — F909 Attention-deficit hyperactivity disorder, unspecified type: Secondary | ICD-10-CM | POA: Insufficient documentation

## 2016-06-22 LAB — COMPREHENSIVE METABOLIC PANEL
ALBUMIN: 4.3 g/dL (ref 3.5–5.0)
ALK PHOS: 88 U/L (ref 38–126)
ALT: 101 U/L — AB (ref 17–63)
AST: 90 U/L — ABNORMAL HIGH (ref 15–41)
Anion gap: 8 (ref 5–15)
BUN: 11 mg/dL (ref 6–20)
CALCIUM: 8.9 mg/dL (ref 8.9–10.3)
CO2: 26 mmol/L (ref 22–32)
CREATININE: 0.94 mg/dL (ref 0.61–1.24)
Chloride: 102 mmol/L (ref 101–111)
GFR calc Af Amer: >60 mL/min (ref >60)
GFR calc non Af Amer: >60 mL/min (ref >60)
GLUCOSE: 99 mg/dL (ref 65–99)
Potassium: 4 mmol/L (ref 3.5–5.1)
SODIUM: 136 mmol/L (ref 135–145)
Total Bilirubin: 0.8 mg/dL (ref 0.3–1.2)
Total Protein: 6.9 g/dL (ref 6.5–8.1)

## 2016-06-22 LAB — SALICYLATE LEVEL: Salicylate Lvl: <7 mg/dL (ref 2.8–30.0)

## 2016-06-22 LAB — CBC WITH DIFFERENTIAL/PLATELET
BASOS PCT: 0 %
Basophils Absolute: 0 10*3/uL (ref 0.0–0.1)
Eosinophils Absolute: 0.7 10*3/uL (ref 0.0–0.7)
Eosinophils Relative: 8 %
HEMATOCRIT: 38.8 % — AB (ref 39.0–52.0)
Hemoglobin: 13.4 g/dL (ref 13.0–17.0)
LYMPHS ABS: 2.7 10*3/uL (ref 0.7–4.0)
Lymphocytes Relative: 30 %
MCH: 30.7 pg (ref 26.0–34.0)
MCHC: 34.5 g/dL (ref 30.0–36.0)
MCV: 89 fL (ref 78.0–100.0)
MONOS PCT: 11 %
Monocytes Absolute: 1 10*3/uL (ref 0.1–1.0)
Neutro Abs: 4.6 10*3/uL (ref 1.7–7.7)
Neutrophils Relative %: 51 %
Platelets: 161 10*3/uL (ref 150–400)
RBC: 4.36 MIL/uL (ref 4.22–5.81)
RDW: 12.9 % (ref 11.5–15.5)
WBC: 8.9 10*3/uL (ref 4.0–10.5)

## 2016-06-22 LAB — ACETAMINOPHEN LEVEL

## 2016-06-22 LAB — ETHANOL: Alcohol, Ethyl (B): <5 mg/dL (ref <5)

## 2016-06-22 MED ORDER — LORAZEPAM 1 MG PO TABS: 0.0000 mg | ORAL_TABLET | Freq: Two times a day (BID) | ORAL | Status: DC

## 2016-06-22 MED ORDER — NICOTINE 21 MG/24HR TD PT24
21.0000 mg | MEDICATED_PATCH | Freq: Every day | TRANSDERMAL | Status: DC
  Administered 2016-06-23: 21 mg via TRANSDERMAL
  Filled 2016-06-22 (×2): qty 1

## 2016-06-22 MED ORDER — ALBUTEROL SULFATE HFA 108 (90 BASE) MCG/ACT IN AERS: 2.0000 | INHALATION_SPRAY | Freq: Four times a day (QID) | RESPIRATORY_TRACT | Status: DC | PRN

## 2016-06-22 MED ORDER — HYDROXYZINE HCL 25 MG PO TABS: 25.0000 mg | ORAL_TABLET | Freq: Four times a day (QID) | ORAL | Status: DC | PRN

## 2016-06-22 MED ORDER — LORAZEPAM 1 MG PO TABS
0.0000 mg | ORAL_TABLET | Freq: Four times a day (QID) | ORAL | Status: DC
  Administered 2016-06-23: 1 mg via ORAL
  Filled 2016-06-22: qty 1

## 2016-06-22 MED ORDER — OLANZAPINE 5 MG PO TBDP
5.0000 mg | ORAL_TABLET | Freq: Once | ORAL | Status: AC
  Administered 2016-06-22: 5 mg via ORAL
  Filled 2016-06-22: qty 1

## 2016-06-22 MED ORDER — ALBUTEROL SULFATE HFA 108 (90 BASE) MCG/ACT IN AERS
4.0000 | INHALATION_SPRAY | Freq: Once | RESPIRATORY_TRACT | Status: AC
  Administered 2016-06-22: 4 via RESPIRATORY_TRACT
  Filled 2016-06-22: qty 6.7

## 2016-06-22 NOTE — ED Notes (Signed)
Pt unable to complete TTS as Pt is falling asleep answering questions. Per TTS will reassess when pt is more alert.

## 2016-06-22 NOTE — BHH Counselor (Signed)
Attempted to assess pt but he kept falling asleep and is not able to answer questions at this time. RN notified- TTS asked to take consult out and put back in when pt is alert.   [address] CourtKristin Abid Bolla ChalcoLPC, [address] BoulevardCASA

## 2016-06-22 NOTE — ED Notes (Addendum)
Pt very demanding, loud and anxious. Cannot draw blood/assess at this time. MD notified, Will reevaluate after pt has calmed down.

## 2016-06-22 NOTE — ED Provider Notes (Signed)
[facility]-EMERGENCY DEPT Provider Note   CSN: [id number] Arrival date & time: 06/22/16  1905     History   Chief Complaint Chief Complaint  Patient presents with  . Suicidal    HPI Mathew Bailey is a 26 y.o. male.  HPI   26 yo M with h/o ADHD, depression, substance abuse here with   Past Medical History:  Diagnosis Date  . ADHD (attention deficit hyperactivity disorder)   . Anemia   . Cellulitis   . GERD (gastroesophageal reflux disease)   . Thrombocytopenia (HCC)   . Tobacco use     Patient Active Problem List   Diagnosis Date Noted  . Severe episode of recurrent major depressive disorder, without psychotic features (HCC)   . Opioid use disorder, severe, dependence (HCC) 07/28/2015  . Major depressive disorder, recurrent episode, severe (HCC) 07/28/2015  . Cellulitis of foot, right 02/13/2014  . R Plantar Puncture wound 02/13/2014  . Tobacco abuse 02/13/2014  . Cellulitis 02/13/2014  . Thrombocytopenia, unspecified 01/22/2013    History reviewed. No pertinent surgical history.     Home Medications    Prior to Admission medications   Medication Sig Start Date End Date Taking? Authorizing Provider  albuterol (PROVENTIL HFA;VENTOLIN HFA) 108 (90 Base) MCG/ACT inhaler Inhale 2 puffs into the lungs every 4 (four) hours as needed for wheezing or shortness of breath. Patient not taking: Reported on 06/22/2016 08/01/15   Sanjuana KavaAgnes I Nwoko, NP  docusate sodium (COLACE) 100 MG capsule Take 1 capsule (100 mg total) by mouth 2 (two) times daily. (May purchase from over the counter at the pharmacy): For Constipation Patient not taking: Reported on 06/22/2016 08/01/15   Sanjuana KavaAgnes I Nwoko, NP  hydrOXYzine (ATARAX/VISTARIL) 25 MG tablet Take 1 tablet (25 mg total) by mouth every 6 (six) hours as needed for anxiety. Patient not taking: Reported on 06/22/2016 08/01/15   Sanjuana KavaAgnes I Nwoko, NP  nicotine (NICODERM CQ - DOSED IN MG/24 HOURS) 21 mg/24hr patch Place 1 patch (21 mg total) onto the skin  daily. For smoking cessation Patient not taking: Reported on 06/22/2016 08/01/15   Sanjuana KavaAgnes I Nwoko, NP  polyethylene glycol (MIRALAX / GLYCOLAX) packet Take 17 g by mouth daily. (May purchase from the over the counter at yr pharmacy): For constipation Patient not taking: Reported on 06/22/2016 08/01/15   Sanjuana KavaAgnes I Nwoko, NP  traZODone (DESYREL) 50 MG tablet Take 1 tablet (50 mg total) by mouth at bedtime as needed and may repeat dose one time if needed for sleep. Patient not taking: Reported on 06/22/2016 08/01/15   Sanjuana KavaAgnes I Nwoko, NP    Family History No family history on file.  Social History Social History  Substance Use Topics  . Smoking status: Current Every Day Smoker    Packs/day: 1.00    Years: 10.00    Types: Cigarettes  . Smokeless tobacco: Never Used  . Alcohol use No     Allergies   Other and Latex   Review of Systems Review of Systems  Constitutional: Positive for fatigue. Negative for chills and fever.  HENT: Negative for congestion and rhinorrhea.   Eyes: Negative for visual disturbance.  Respiratory: Negative for cough, shortness of breath and wheezing.   Cardiovascular: Negative for chest pain and leg swelling.  Gastrointestinal: Negative for abdominal pain, diarrhea, nausea and vomiting.  Genitourinary: Negative for dysuria and flank pain.  Musculoskeletal: Negative for neck pain and neck stiffness.  Skin: Negative for rash and wound.  Allergic/Immunologic: Negative for immunocompromised state.  Neurological: Negative for syncope, weakness and headaches.  Psychiatric/Behavioral: Positive for dysphoric mood and suicidal ideas.  All other systems reviewed and are negative.    Physical Exam Updated Vital Signs BP 142/94   Pulse 99   Resp 16   SpO2 95%   Physical Exam  Constitutional: He is oriented to person, place, and time. He appears well-developed and well-nourished. No distress.  HENT:  Head: Normocephalic and atraumatic.  Eyes: Conjunctivae are normal.    Neck: Neck supple.  Cardiovascular: Normal rate, regular rhythm and normal heart sounds.  Exam reveals no friction rub.   No murmur heard. Pulmonary/Chest: Effort normal and breath sounds normal. No respiratory distress. He has no wheezes. He has no rales.  Abdominal: He exhibits no distension.  Musculoskeletal: He exhibits no edema.  Neurological: He is alert and oriented to person, place, and time. He exhibits normal muscle tone.  Skin: Skin is warm. Capillary refill takes less than 2 seconds.  Psychiatric: He has a normal mood and affect. His speech is slurred. He is agitated and aggressive.  Nursing note and vitals reviewed.    ED Treatments / Results  Labs (all labs ordered are listed, but only abnormal results are displayed) Labs Reviewed  COMPREHENSIVE METABOLIC PANEL - Abnormal; Notable for the following:       Result Value   AST 90 (*)    ALT 101 (*)    All other components within normal limits  CBC WITH DIFFERENTIAL/PLATELET - Abnormal; Notable for the following:    HCT 38.8 (*)    All other components within normal limits  ACETAMINOPHEN LEVEL - Abnormal; Notable for the following:    Acetaminophen (Tylenol), Serum <10 (*)    All other components within normal limits  ETHANOL  SALICYLATE LEVEL  RAPID URINE DRUG SCREEN, HOSP PERFORMED  HEPATITIS PANEL, ACUTE  RAPID HIV SCREEN (HIV 1/2 AB+AG)    EKG  EKG Interpretation None       Radiology Dg Chest 2 View  Result Date: 06/22/2016 CLINICAL DATA:  26 year old male with cough EXAM: CHEST  2 VIEW COMPARISON:  Chest radiograph dated 09/13/2014 FINDINGS: Mild hyperinflation of the lungs as seen on the prior radiograph may be related to underlying asthma. Clinical correlation is recommended. There is no focal consolidation, pleural effusion, or pneumothorax. The cardiac silhouette is within normal limits. No acute osseous pathology. IMPRESSION: No active cardiopulmonary disease. Electronically Signed   By: Elgie Collard M.D.   On: 06/22/2016 21:19    Procedures Procedures (including critical care time)  Medications Ordered in ED Medications  LORazepam (ATIVAN) tablet 0-4 mg (1 mg Oral Given 06/23/16 0104)    Followed by  LORazepam (ATIVAN) tablet 0-4 mg (not administered)  albuterol (PROVENTIL HFA;VENTOLIN HFA) 108 (90 Base) MCG/ACT inhaler 2 puff (not administered)  nicotine (NICODERM CQ - dosed in mg/24 hours) patch 21 mg (21 mg Transdermal Not Given 06/22/16 2228)  hydrOXYzine (ATARAX/VISTARIL) tablet 25 mg (not administered)  albuterol (PROVENTIL HFA;VENTOLIN HFA) 108 (90 Base) MCG/ACT inhaler 4 puff (4 puffs Inhalation Given 06/22/16 2114)  OLANZapine zydis (ZYPREXA) disintegrating tablet 5 mg (5 mg Oral Given 06/22/16 2114)  acetaminophen (TYLENOL) tablet 650 mg (650 mg Oral Given 06/23/16 0114)     Initial Impression / Assessment and Plan / ED Course  I have reviewed the triage vital signs and the nursing notes.  Pertinent labs & imaging results that were available during my care of the patient were reviewed by me and considered in my medical  decision making (see chart for details).    26 yo M with PMHx as above here reported suicidal ideation in setting of DWI earlier this evening. On arrival, VSS and WNL. Exam is as above. Pt well appearing. He is requesting for narcotics and benzodiazepines by name. Concern for substance seeking behavior, with possible component of SI. Labs unremarkable. Will c/s TTS.  While awaiting TTS, pt agitated, aggressive towards staff, refusing lab work until he gets meds for his anxiety. I am hesitant to give him any controlled substances. Will offer PO zyprexa for sleep/agitation. CIWA started but informed RN to be cautious of administration and notify MD of need for benzos.  **Of note, pt has mild AST/ALT elevation, with h/o IVDU. Will check Hep panel, HIV.  Final Clinical Impressions(s) / ED Diagnoses   Final diagnoses:  Suicidal ideation  Transaminitis   Benzodiazepine dependence (HCC)      Shaune Pollack, MD 06/23/16 [phone]

## 2016-06-22 NOTE — ED Notes (Signed)
Patient transported to X-ray 

## 2016-06-22 NOTE — ED Triage Notes (Signed)
Per IVC paperwork: "Respondent stated to deputy that he has nothing to live for and that he is going to kill himself tonight because he got a DWI". Pt is handcuffed upon arrival, loud, argumentative, threatening to "sue everybody"

## 2016-06-22 NOTE — ED Notes (Signed)
Pt verbalizes recent loss of support system (girlfriend) d/t pts relapse in Feburary. Pt states he feels hopeless since she has been gone and reports increased anxiety and difficulty sleeping since then. Pt tearful during conversation. Therapeutic discussion of feelings used. Pt denies SI/HI at this time.

## 2016-06-22 NOTE — ED Notes (Signed)
Pt refused vital signs.

## 2016-06-22 NOTE — ED Notes (Signed)
Meal tray, Drink, Cheree DittoGraham Crackers and PB given to pt.

## 2016-06-23 ENCOUNTER — Encounter (HOSPITAL_COMMUNITY): Payer: Self-pay | Admitting: Emergency Medicine

## 2016-06-23 DIAGNOSIS — F1220 Cannabis dependence, uncomplicated: Secondary | ICD-10-CM | POA: Diagnosis not present

## 2016-06-23 LAB — RAPID HIV SCREEN (HIV 1/2 AB+AG)
HIV 1/2 ANTIBODIES: NONREACTIVE
HIV-1 P24 Antigen - HIV24: NONREACTIVE

## 2016-06-23 LAB — RAPID URINE DRUG SCREEN, HOSP PERFORMED
AMPHETAMINES: NOT DETECTED
Barbiturates: NOT DETECTED
Benzodiazepines: POSITIVE — AB
COCAINE: NOT DETECTED
OPIATES: NOT DETECTED
TETRAHYDROCANNABINOL: POSITIVE — AB

## 2016-06-23 MED ORDER — TRAZODONE HCL 50 MG PO TABS: 50.0000 mg | ORAL_TABLET | Freq: Every evening | ORAL | Status: DC | PRN

## 2016-06-23 MED ORDER — ACETAMINOPHEN 325 MG PO TABS
650.0000 mg | ORAL_TABLET | Freq: Once | ORAL | Status: AC
  Administered 2016-06-23: 650 mg via ORAL
  Filled 2016-06-23: qty 2

## 2016-06-23 MED ORDER — CITALOPRAM HYDROBROMIDE 10 MG PO TABS
10.0000 mg | ORAL_TABLET | Freq: Every day | ORAL | Status: DC
  Administered 2016-06-23: 10 mg via ORAL
  Filled 2016-06-23 (×2): qty 1

## 2016-06-23 NOTE — BH Assessment (Addendum)
Assessment Note  Mathew Bailey is a 26 y.o. male who presents under IVC from [facility] Sheriff's office. IVC states: "Respondent stated to deputy that he has nothing to live for and that he is going to kill himself tonight because he got a DWI". Since arriving to the ED, pt has denied SI and HI. He has displayed bouts of anxiety, tearfulness and hopelessness as well as episodes of being demanding and belligerent (requesting narcotics & benzos by name and refusing lab work until he rec'd something for anxiety). Pt was too somnolent to complete assessment last night. Today, pt is still very somnolent and proved to be a poor historian. Clinician had to constantly call pt's name to rouse him from sleep. The following information is what clinician was able to ascertain from pt: Pt denies having a psychiatrist or therapist. Pt is prescribed 2 mg Xanax, but was unable to identify who the prescribing doctor was. Pt says he's in the ED bc "they think I'm suicidal". Pt denies being suicidal currently. He was unable to explain why "they" think he's suicidal. Pt became irritable as clinician attempted to obtain further information and yelled, "I don't know why I'm here!". Assessment ended, at this point.    Diagnosis: Deferred  Past Medical History:  Past Medical History:  Diagnosis Date  . ADHD (attention deficit hyperactivity disorder)   . Anemia   . Cellulitis   . GERD (gastroesophageal reflux disease)   . Thrombocytopenia (HCC)   . Tobacco use     History reviewed. No pertinent surgical history.  Family History: No family history on file.  Social History:  reports that he has been smoking Cigarettes.  He has a 10.00 pack-year smoking history. He has never used smokeless tobacco. He reports that he uses drugs. He reports that he does not drink alcohol.  Additional Social History:  Alcohol / Drug Use Pain Medications: see PTA  Prescriptions: see PTA (pt says he takes Xanax) Over the Counter: see  PTA History of alcohol / drug use?: Yes (Pt has hx of opioid abuse, but was not cooperative with giving details)  CIWA: CIWA-Ar BP: (!) 134/54 Pulse Rate: 66 Nausea and Vomiting: no nausea and no vomiting Tactile Disturbances: none Tremor: no tremor Auditory Disturbances: not present Paroxysmal Sweats: no sweat visible Visual Disturbances: not present Anxiety: no anxiety, at ease (sleeping/ resting with eyes closed) Headache, Fullness in Head: none present Agitation: normal activity Orientation and Clouding of Sensorium: oriented and can do serial additions CIWA-Ar Total: 0 COWS:    Allergies:  Allergies  Allergen Reactions  . Other Other (See Comments)    Stated that Narcotics causes patient severe aggravation and mood changes. ADVISED NOT TO TAKE OR BE PRESCRIBED THIS MEDICATION  . Latex Rash    Home Medications:  (Not in a hospital admission)  OB/GYN Status:  No LMP for male patient.  General Assessment Data Location of Assessment: [facility] ED TTS Assessment: In system Is this a Tele or Face-to-Face Assessment?: Face-to-Face Is this an Initial Assessment or a Re-assessment for this encounter?: Initial Assessment Marital status: Single Living Arrangements: Parent Can pt return to current living arrangement?: Yes Admission Status: Involuntary Is patient capable of signing voluntary admission?: Yes Referral Source: Self/Family/Friend     Crisis Care Plan Living Arrangements: Parent Name of Psychiatrist: none Name of Therapist: none  Education Status Is patient currently in school?: No  Risk to self with the past 6 months Suicidal Ideation: No-Not Currently/Within Last 6 Months Has patient  been a risk to self within the past 6 months prior to admission? : No Suicidal Intent: No Has patient had any suicidal intent within the past 6 months prior to admission? : No Is patient at risk for suicide?: No Suicidal Plan?: No Has patient had any suicidal plan within the  past 6 months prior to admission? : No Access to Means: No What has been your use of drugs/alcohol within the last 12 months?: see above Previous Attempts/Gestures: No Intentional Self Injurious Behavior: None Family Suicide History: Unknown Recent stressful life event(s): Other (Comment) (DWI) Persecutory voices/beliefs?: No Depression Symptoms: Feeling angry/irritable Substance abuse history and/or treatment for substance abuse?: Yes Suicide prevention information given to non-admitted patients: Not applicable  Risk to Others within the past 6 months Homicidal Ideation: No Does patient have any lifetime risk of violence toward others beyond the six months prior to admission? : Unknown Thoughts of Harm to Others: No Current Homicidal Intent: No Current Homicidal Plan: No Access to Homicidal Means: No History of harm to others?: No Assessment of Violence: None Noted Does patient have access to weapons?: No Criminal Charges Pending?: Yes Describe Pending Criminal Charges: many traffic infractions; misdemeanor possess drug paraphernalia; misdemeanor Pension scheme manager; DWI Does patient have a court date: Yes Court Date: 07/19/16 (07/22/2016; 07/27/2016; 07/28/2016) Is patient on probation?: Unknown  Psychosis Hallucinations: None noted Delusions: None noted  Mental Status Report Appearance/Hygiene: Unremarkable Eye Contact: Poor Motor Activity: Agitation Speech: Incoherent Level of Consciousness: Sleeping, Irritable, Drowsy Mood: Irritable Affect: Appropriate to circumstance Anxiety Level: None Thought Processes: Unable to Assess Judgement: Unable to Assess Orientation: Appropriate for developmental age Obsessive Compulsive Thoughts/Behaviors: None  Cognitive Functioning Concentration: Poor Memory: Unable to Assess IQ: Average Insight: Unable to Assess Impulse Control: Unable to Assess Vegetative Symptoms: Unable to Assess  ADLScreening [facility] Assessment  Services) Patient's cognitive ability adequate to safely complete daily activities?: Yes Patient able to express need for assistance with ADLs?: Yes Independently performs ADLs?: Yes (appropriate for developmental age)  Prior Inpatient Therapy Prior Inpatient Therapy: Yes Prior Therapy Dates: 07/2015 Prior Therapy Facilty/Provider(s): [facility] Reason for Treatment: MDD; Opiate Abuse  Prior Outpatient Therapy Prior Outpatient Therapy: No Does patient have an ACCT team?: No Does patient have Intensive In-House Services?  : No Does patient have [facility] services? : Unknown Does patient have P4CC services?: No  ADL Screening (condition at time of admission) Patient's cognitive ability adequate to safely complete daily activities?: Yes Is the patient deaf or have difficulty hearing?: No Does the patient have difficulty seeing, even when wearing glasses/contacts?: No Does the patient have difficulty concentrating, remembering, or making decisions?: Yes Patient able to express need for assistance with ADLs?: Yes Does the patient have difficulty dressing or bathing?: No Independently performs ADLs?: Yes (appropriate for developmental age) Does the patient have difficulty walking or climbing stairs?: No Weakness of Legs: None Weakness of Arms/Hands: None  Home Assistive Devices/Equipment Home Assistive Devices/Equipment: None    Abuse/Neglect Assessment (Assessment to be complete while patient is alone) Physical Abuse: Denies Verbal Abuse: Denies Sexual Abuse: Denies Exploitation of patient/patient's resources: Denies Self-Neglect: Denies Values / Beliefs Cultural Requests During Hospitalization: None Spiritual Requests During Hospitalization: None Consults Spiritual Care Consult Needed: No Social Work Consult Needed: No Merchant navy officer (For Healthcare) Does Patient Have a Medical Advance Directive?: No Would patient like information on creating a medical advance directive?:  No - Patient declined    Additional Information 1:1 In Past 12 Months?: No CIRT Risk: No Elopement  Risk: No Does patient have medical clearance?: Yes     Disposition:  Disposition Initial Assessment Completed for this Encounter: Yes  On Site Evaluation by:   Reviewed with Physician:    Laddie AquasSamantha M Lavel Rieman 06/23/2016 8:30 AM

## 2016-06-23 NOTE — ED Notes (Signed)
Ativan not given as pt is sleeping, will administer when pt wakes up.

## 2016-06-23 NOTE — ED Notes (Signed)
Pt acutely woke up w/ c/o severe R foot pain. Pt is tearful, screaming, and rocking back and forth. Pt will not verbalize pain descriptors only states it is on top of foot. Assessment of R foot shows no acute findings. No swelling/deformity/warmth/rash. Effie ShyWentz, MD notified.

## 2016-06-23 NOTE — ED Notes (Signed)
Pt is sleeping, d/t anxiety/agitation hx. HIV blood draw will be done in morning per Erma HeritageIsaacs, MD.

## 2016-06-23 NOTE — Progress Notes (Signed)
06/23/16 1358:  LRT went to pt room to offer activities.  Pt was sleep.  Caroll RancherMarjette Areyanna Figeroa, LRT/CTRS

## 2016-06-23 NOTE — ED Notes (Signed)
Bed: [facility]39 Expected date:  Expected time:  Means of arrival:  Comments: Room 29 

## 2016-06-23 NOTE — BH Assessment (Addendum)
[facility] Assessment Progress Note  Per Thedore MinsMojeed Akintayo, MD, this pt requires psychiatric hospitalization at this time.  The following facilities have been contacted to seek placement for this pt, with results as noted:  Beds available, information sent, decision pending:  [facility]   At [facility]  [facility]   Doylene Canninghomas Keisi [place], [place] Triage Specialist [phone]

## 2016-06-24 ENCOUNTER — Encounter (HOSPITAL_COMMUNITY): Payer: Self-pay | Admitting: Emergency Medicine

## 2016-06-24 DIAGNOSIS — F1994 Other psychoactive substance use, unspecified with psychoactive substance-induced mood disorder: Secondary | ICD-10-CM | POA: Diagnosis present

## 2016-06-24 DIAGNOSIS — F1320 Sedative, hypnotic or anxiolytic dependence, uncomplicated: Secondary | ICD-10-CM

## 2016-06-24 DIAGNOSIS — Z91040 Latex allergy status: Secondary | ICD-10-CM

## 2016-06-24 DIAGNOSIS — F17210 Nicotine dependence, cigarettes, uncomplicated: Secondary | ICD-10-CM

## 2016-06-24 DIAGNOSIS — F1120 Opioid dependence, uncomplicated: Secondary | ICD-10-CM | POA: Diagnosis not present

## 2016-06-24 DIAGNOSIS — Z79899 Other long term (current) drug therapy: Secondary | ICD-10-CM

## 2016-06-24 DIAGNOSIS — Z888 Allergy status to other drugs, medicaments and biological substances status: Secondary | ICD-10-CM

## 2016-06-24 LAB — HEPATITIS PANEL, ACUTE
HCV Ab: <0.1 s/co ratio (ref 0.0–0.9)
HEP B C IGM: NEGATIVE
Hep A IgM: NEGATIVE
Hepatitis B Surface Ag: NEGATIVE

## 2016-06-24 MED ORDER — CITALOPRAM HYDROBROMIDE 10 MG PO TABS: 10.0000 mg | ORAL_TABLET | Freq: Every day | ORAL | 0 refills | Status: DC

## 2016-06-24 MED ORDER — HYDROXYZINE HCL 25 MG PO TABS: 25.0000 mg | ORAL_TABLET | Freq: Four times a day (QID) | ORAL | 0 refills | Status: DC | PRN

## 2016-06-24 MED ORDER — TRAZODONE HCL 50 MG PO TABS: 50.0000 mg | ORAL_TABLET | Freq: Every evening | ORAL | 0 refills | Status: DC | PRN

## 2016-06-24 NOTE — Discharge Instructions (Addendum)
To help you maintain a sober lifestyle, a substance abuse treatment program may be beneficial to you.  Contact [facility] at your earliest opportunity to ask about enrolling in their program: ° °     [facility], [place] °     [address] °     [place], [place] [address] °     [phone] ° ° °

## 2016-06-24 NOTE — Progress Notes (Signed)
CSW contacted [facility] to inquire about patient's referral. CSW spoke with staff member named Sheralyn Boatmanoni. Staff requested that CSW re-fax patient referral and agreed to contact CSW with update on patient's referral status. CSW faxed patient's referral to provided fax number [phone]).

## 2016-06-24 NOTE — BH Assessment (Signed)
[facility] Assessment Progress Note  Per Thedore MinsMojeed Akintayo, MD, this pt does not require psychiatric hospitalization at this time.  Pt presents under IVC initiated by sheriff, which Dr Jannifer FranklinAkintayo has rescinded.  [facility] to [facility] up [place] [facility] [place].  This has been included in pt's discharge instructions.  Pt's nurse, Dawnaly, has been notified.  Mathew Canninghomas Lanorris Kalisz, [place] Triage Specialist [phone]

## 2016-06-24 NOTE — Consult Note (Signed)
[facility] Consult   Reason for Consult:  Substance abuse with suicidal ideations Referring Physician:  EDP Patient Identification: Mathew Bailey MRN:  [id number] Principal Diagnosis: Substance induced mood disorder [facility]) Diagnosis:   Patient Active Problem List   Diagnosis Date Noted  . Substance induced mood disorder ([place]) [F19.94] 06/24/2016    Priority: High  . Opioid use disorder, severe, dependence ([place]) [F11.20] 07/28/2015    Priority: High  . Cannabis use disorder, severe, dependence ([place]) [F12.20] 06/23/2016  . Severe episode of recurrent major depressive disorder, without psychotic features ([place]) [F33.2]   . Major depressive disorder, recurrent episode, severe ([place]) [F33.2] 07/28/2015  . Cellulitis of foot, right [L03.115] 02/13/2014  . R Plantar Puncture wound [T14.8XXA] 02/13/2014  . Tobacco abuse [Z72.0] 02/13/2014  . Cellulitis [L03.90] 02/13/2014  . Thrombocytopenia, unspecified [D69.6] 01/22/2013    Total Time spent with patient: 45 minutes  Subjective:   Mathew Bailey is a 26 y.o. male patient does not warrant admission.  HPI:  26 yo male who was arrested last night for a DWI (Fentanyl) and reports he said, "I'm going to kill myself", "because I was upset".  He was also still under the influence and quite belligerent.  Today, he is clear and coherent with no suicidal ideations and denied past suicide attempts.  He reports using Fentanyl but none in his screening except benzodiazepines and THC.  No homicidal ideations, hallucinations, and withdrawal symptoms.  He was not interested in going to rehab but encouraged to at least go to outpatient substance abuse services and how it would make his case better in court.  He was agreeable, resources provided in his living area.  Mathew Bailey lives with his parents and works at [employer].    Past Psychiatric History: substance abuse  Risk to Self: Suicidal Ideation: No-Not Currently/Within Last 6 Months Suicidal  Intent: No Is patient at risk for suicide?: No Suicidal Plan?: No Access to Means: No What has been your use of drugs/alcohol within the last 12 months?: see above Intentional Self Injurious Behavior: None Risk to Others: Homicidal Ideation: No Thoughts of Harm to Others: No Current Homicidal Intent: No Current Homicidal Plan: No Access to Homicidal Means: No History of harm to others?: No Assessment of Violence: None Noted Does patient have access to weapons?: No Criminal Charges Pending?: Yes Describe Pending Criminal Charges: many traffic infractions; misdemeanor possess drug paraphernalia; misdemeanor Administrator; DWI Does patient have a court date: Yes Court Date: 07/19/16 (07/22/2016; 07/27/2016; 07/28/2016) Prior Inpatient Therapy: Prior Inpatient Therapy: Yes Prior Therapy Dates: 07/2015 Prior Therapy Facilty/Provider(s): [facility] Reason for Treatment: MDD; Opiate Abuse Prior Outpatient Therapy: Prior Outpatient Therapy: No Does patient have an ACCT team?: No Does patient have Intensive In-House Services?  : No Does patient have [facility] services? : Unknown Does patient have P4CC services?: No  Past Medical History:  Past Medical History:  Diagnosis Date  . ADHD (attention deficit hyperactivity disorder)   . Anemia   . Cellulitis   . GERD (gastroesophageal reflux disease)   . Thrombocytopenia ([place])   . Tobacco use    History reviewed. No pertinent surgical history. Family History: No family history on file. Family Psychiatric  History: none  Social History:  History  Alcohol Use No     History  Drug Use    Comment: Mari[place]ana, Cocaine. Daily use except cocaine is once a month. Street drugs.     Social History   Social History  . Marital status: Single  Spouse name: N/A  . Number of children: N/A  . Years of education: N/A   Social History Main Topics  . Smoking status: Current Every Day Smoker    Packs/day: 1.00    Years: 10.00     Types: Cigarettes  . Smokeless tobacco: Never Used  . Alcohol use No  . Drug use: Yes     Comment: [place]ana, Cocaine. Daily use except cocaine is once a month. Street drugs.   . Sexual activity: Not Asked   Other Topics Concern  . None   Social History Narrative  . None   Additional Social History:    Allergies:   Allergies  Allergen Reactions  . Other Other (See Comments)    Stated that Narcotics causes patient severe aggravation and mood changes. ADVISED NOT TO TAKE OR BE PRESCRIBED THIS MEDICATION  . Latex Rash    Labs:  Results for orders placed or performed during the hospital encounter of 06/22/16 (from the past 48 hour(s))  Comprehensive metabolic panel     Status: Abnormal   Collection Time: 06/22/16  9:16 PM  Result Value Ref Range   Sodium 136 135 - 145 mmol/L   Potassium 4.0 3.5 - 5.1 mmol/L   Chloride 102 101 - 111 mmol/L   CO2 26 22 - 32 mmol/L   Glucose, Bld 99 65 - 99 mg/dL   BUN 11 6 - 20 mg/dL   Creatinine, Ser 0.94 0.61 - 1.24 mg/dL   Calcium 8.9 8.9 - 10.3 mg/dL   Total Protein 6.9 6.5 - 8.1 g/dL   Albumin 4.3 3.5 - 5.0 g/dL   AST 90 (H) 15 - 41 U/L   ALT 101 (H) 17 - 63 U/L   Alkaline Phosphatase 88 38 - 126 U/L   Total Bilirubin 0.8 0.3 - 1.2 mg/dL   GFR calc non Af Amer >60 >60 mL/min   GFR calc Af Amer >60 >60 mL/min    Comment: (NOTE) The eGFR has been calculated using the CKD EPI equation. This calculation has not been validated in all clinical situations. eGFR's persistently <60 mL/min signify possible Chronic Kidney Disease.    Anion gap 8 5 - 15  Ethanol     Status: None   Collection Time: 06/22/16  9:16 PM  Result Value Ref Range   Alcohol, Ethyl (B) <5 <5 mg/dL    Comment:        LOWEST DETECTABLE LIMIT FOR SERUM ALCOHOL IS 5 mg/dL FOR MEDICAL PURPOSES ONLY   CBC with Diff     Status: Abnormal   Collection Time: 06/22/16  9:16 PM  Result Value Ref Range   WBC 8.9 4.0 - 10.5 K/uL   RBC 4.36 4.22 - 5.81 MIL/uL   Hemoglobin  13.4 13.0 - 17.0 g/dL   HCT 38.8 (L) 39.0 - 52.0 %   MCV 89.0 78.0 - 100.0 fL   MCH 30.7 26.0 - 34.0 pg   MCHC 34.5 30.0 - 36.0 g/dL   RDW 12.9 11.5 - 15.5 %   Platelets 161 150 - 400 K/uL   Neutrophils Relative % 51 %   Neutro Abs 4.6 1.7 - 7.7 K/uL   Lymphocytes Relative 30 %   Lymphs Abs 2.7 0.7 - 4.0 K/uL   Monocytes Relative 11 %   Monocytes Absolute 1.0 0.1 - 1.0 K/uL   Eosinophils Relative 8 %   Eosinophils Absolute 0.7 0.0 - 0.7 K/uL   Basophils Relative 0 %   Basophils Absolute 0.0 0.0 - 0.1  K/uL  Salicylate level     Status: None   Collection Time: 06/22/16  9:16 PM  Result Value Ref Range   Salicylate Lvl <0.0 2.8 - 30.0 mg/dL  Acetaminophen level     Status: Abnormal   Collection Time: 06/22/16  9:16 PM  Result Value Ref Range   Acetaminophen (Tylenol), Serum <10 (L) 10 - 30 ug/mL    Comment:        THERAPEUTIC CONCENTRATIONS VARY SIGNIFICANTLY. A RANGE OF 10-30 ug/mL MAY BE AN EFFECTIVE CONCENTRATION FOR MANY PATIENTS. HOWEVER, SOME ARE BEST TREATED AT CONCENTRATIONS OUTSIDE THIS RANGE. ACETAMINOPHEN CONCENTRATIONS >150 ug/mL AT 4 HOURS AFTER INGESTION AND >50 ug/mL AT 12 HOURS AFTER INGESTION ARE OFTEN ASSOCIATED WITH TOXIC REACTIONS.   Hepatitis panel, acute     Status: None   Collection Time: 06/22/16  9:16 PM  Result Value Ref Range   Hepatitis B Surface Ag Negative Negative   HCV Ab <0.1 0.0 - 0.9 s/co ratio    Comment: (NOTE)                                  Negative:     < 0.8                             Indeterminate: 0.8 - 0.9                                  Positive:     > 0.9 The CDC recommends that a positive HCV antibody result be followed up with a HCV Nucleic Acid Amplification test ([id number]). Performed At: [facility] [place], [place] [id number] Lindon Romp MD [id number]    Hep A IgM Negative Negative   Hep B C IgM Negative Negative  Rapid HIV screen (HIV 1/2 Ab+Ag)     Status: None   Collection  Time: 06/22/16  9:16 PM  Result Value Ref Range   HIV-1 P24 Antigen - HIV24 NON REACTIVE NON REACTIVE   HIV 1/2 Antibodies NON REACTIVE NON REACTIVE   Interpretation (HIV Ag Ab)      A non reactive test result means that HIV 1 or HIV 2 antibodies and HIV 1 p24 antigen were not detected in the specimen.    Comment: RESULT CALLED TO, READ BACK BY AND VERIFIED WITH: K RIDGE RN [phone] 06/23/16 A NAVARRO   Urine rapid drug screen (hosp performed)not at [facility]     Status: Abnormal   Collection Time: 06/23/16 12:49 AM  Result Value Ref Range   Opiates NONE DETECTED NONE DETECTED   Cocaine NONE DETECTED NONE DETECTED   Benzodiazepines POSITIVE (A) NONE DETECTED   Amphetamines NONE DETECTED NONE DETECTED   Tetrahydrocannabinol POSITIVE (A) NONE DETECTED   Barbiturates NONE DETECTED NONE DETECTED    Comment:        DRUG SCREEN FOR MEDICAL PURPOSES ONLY.  IF CONFIRMATION IS NEEDED FOR ANY PURPOSE, NOTIFY LAB WITHIN 5 DAYS.        LOWEST DETECTABLE LIMITS FOR URINE DRUG SCREEN Drug Class       Cutoff (ng/mL) Amphetamine      1000 Barbiturate      200 Benzodiazepine   262 Tricyclics       035 Opiates          300  Cocaine          300 THC              50     Current Facility-Administered Medications  Medication Dose Route Frequency Provider Last Rate Last Dose  . albuterol (PROVENTIL HFA;VENTOLIN HFA) 108 (90 Base) MCG/ACT inhaler 2 puff  2 puff Inhalation Q6H PRN Duffy Bruce, MD      . citalopram (CELEXA) tablet 10 mg  10 mg Oral Daily Lopez Dentinger, MD   10 mg at 06/23/16 1220  . hydrOXYzine (ATARAX/VISTARIL) tablet 25 mg  25 mg Oral Q6H PRN Duffy Bruce, MD      . nicotine (NICODERM CQ - dosed in mg/24 hours) patch 21 mg  21 mg Transdermal Daily Duffy Bruce, MD   21 mg at 06/23/16 1220  . traZODone (DESYREL) tablet 50 mg  50 mg Oral QHS PRN,MR X 1 Wyllow Seigler, MD       Current Outpatient Prescriptions  Medication Sig Dispense Refill  . albuterol (PROVENTIL HFA;VENTOLIN  HFA) 108 (90 Base) MCG/ACT inhaler Inhale 2 puffs into the lungs every 4 (four) hours as needed for wheezing or shortness of breath. (Patient not taking: Reported on 06/22/2016)    . docusate sodium (COLACE) 100 MG capsule Take 1 capsule (100 mg total) by mouth 2 (two) times daily. (May purchase from over the counter at the pharmacy): For Constipation (Patient not taking: Reported on 06/22/2016) 10 capsule 0  . hydrOXYzine (ATARAX/VISTARIL) 25 MG tablet Take 1 tablet (25 mg total) by mouth every 6 (six) hours as needed for anxiety. (Patient not taking: Reported on 06/22/2016) 60 tablet 0  . nicotine (NICODERM CQ - DOSED IN MG/24 HOURS) 21 mg/24hr patch Place 1 patch (21 mg total) onto the skin daily. For smoking cessation (Patient not taking: Reported on 06/22/2016) 28 patch 0  . polyethylene glycol (MIRALAX / GLYCOLAX) packet Take 17 g by mouth daily. (May purchase from the over the counter at yr pharmacy): For constipation (Patient not taking: Reported on 06/22/2016) 14 each 0  . traZODone (DESYREL) 50 MG tablet Take 1 tablet (50 mg total) by mouth at bedtime as needed and may repeat dose one time if needed for sleep. (Patient not taking: Reported on 06/22/2016) 60 tablet 0    Musculoskeletal: Strength & Muscle Tone: within normal limits Gait & Station: normal Patient leans: N/A  Psychiatric Specialty Exam: Physical Exam  Constitutional: He is oriented to person, place, and time. He appears well-developed and well-nourished.  HENT:  Head: Normocephalic.  Neck: Normal range of motion.  Respiratory: Effort normal.  Musculoskeletal: Normal range of motion.  Neurological: He is alert and oriented to person, place, and time.  Psychiatric: He has a normal mood and affect. His speech is normal and behavior is normal. Judgment and thought content normal. Cognition and memory are normal.    Review of Systems  Psychiatric/Behavioral: Positive for substance abuse.  All other systems reviewed and are  negative.   Blood pressure 129/66, pulse 93, temperature 98.1 F (36.7 C), temperature source Oral, resp. rate 19, SpO2 95 %.There is no height or weight on file to calculate BMI.  General Appearance: Disheveled  Eye Contact:  Good  Speech:  Normal Rate  Volume:  Normal  Mood:  Irritable, mild  Affect:  Congruent  Thought Process:  Coherent and Descriptions of Associations: Intact  Orientation:  Full (Time, Place, and Person)  Thought Content:  WDL  Suicidal Thoughts:  No  Homicidal Thoughts:  No  Memory:  Immediate;   Good Recent;   Good Remote;   Good  Judgement:  Fair  Insight:  Fair  Psychomotor Activity:  Normal  Concentration:  Concentration: Good and Attention Span: Good  Recall:  Good  Fund of Knowledge:  Good  Language:  Good  Akathisia:  No  Handed:  Right  AIMS (if indicated):     Assets:  Housing Leisure Time Physical Health Resilience Social Support Vocational/Educational  ADL's:  Intact  Cognition:  WNL  Sleep:        Treatment Plan Summary: Daily contact with patient to assess and evaluate symptoms and progress in treatment, Medication management and Plan substance induced mood disorder:  -Crisis stabilization -Medication management:  Started Celexa 10 mg daily for depression/anxiety, Trazodone 50 mg at bedtime PRN for sleep, and Vistaril 25 mg every six hours PRN anxiety -Individual and substance abuse counseling -Outpatient resources provided  Disposition: No evidence of imminent risk to self or others at present.    Waylan Boga, NP 06/24/2016 10:39 AM  Patient seen face-to-face for psychiatric evaluation, chart reviewed and case discussed with the physician extender and developed treatment plan. Reviewed the information documented and agree with the treatment plan. Corena Pilgrim, MD

## 2016-06-24 NOTE — [facility] Suicide Risk Assessment (Signed)
Suicide Risk Assessment  Discharge Assessment   [facility]   Principal Problem: Substance induced mood disorder [facility](HCC) Discharge Diagnoses:  Patient Active Problem List   Diagnosis Date Noted  . Substance induced mood disorder ([facility]) [F19.94] 06/24/2016    Priority: High  . Opioid use disorder, severe, dependence ([facility]) [F11.20] 07/28/2015    Priority: High  . Benzodiazepine dependence ([facility]) [F13.20]   . Cannabis use disorder, severe, dependence ([facility]) [F12.20] 06/23/2016  . Severe episode of recurrent major depressive disorder, without psychotic features ([facility]) [F33.2]   . Major depressive disorder, recurrent episode, severe ([facility]) [F33.2] 07/28/2015  . Cellulitis of foot, right [L03.115] 02/13/2014  . R Plantar Puncture wound [T14.8XXA] 02/13/2014  . Tobacco abuse [Z72.0] 02/13/2014  . Cellulitis [L03.90] 02/13/2014  . Thrombocytopenia, unspecified [D69.6] 01/22/2013    Total Time spent with patient: 45 minutes  Musculoskeletal: Strength & Muscle Tone: within normal limits Gait & Station: normal Patient leans: N/A  Psychiatric Specialty Exam: Physical Exam  Constitutional: He is oriented to person, place, and time. He appears well-developed and well-nourished.  HENT:  Head: Normocephalic.  Neck: Normal range of motion.  Respiratory: Effort normal.  Musculoskeletal: Normal range of motion.  Neurological: He is alert and oriented to person, place, and time.  Psychiatric: He has a normal mood and affect. His speech is normal and behavior is normal. Judgment and thought content normal. Cognition and memory are normal.    Review of Systems  Psychiatric/Behavioral: Positive for substance abuse.  All other systems reviewed and are negative.   Blood pressure 129/66, pulse 93, temperature 98.1 F (36.7 C), temperature source Oral, resp. rate 19, SpO2 95 %.There is no height or weight on file to calculate BMI.  General Appearance: Disheveled  Eye Contact:   Good  Speech:  Normal Rate  Volume:  Normal  Mood:  Irritable, mild  Affect:  Congruent  Thought Process:  Coherent and Descriptions of Associations: Intact  Orientation:  Full (Time, Place, and Person)  Thought Content:  WDL  Suicidal Thoughts:  No  Homicidal Thoughts:  No  Memory:  Immediate;   Good Recent;   Good Remote;   Good  Judgement:  Fair  Insight:  Fair  Psychomotor Activity:  Normal  Concentration:  Concentration: Good and Attention Span: Good  Recall:  Good  Fund of Knowledge:  Good  Language:  Good  Akathisia:  No  Handed:  Right  AIMS (if indicated):     Assets:  Housing Leisure Time Physical Health Resilience Social Support Vocational/Educational  ADL's:  Intact  Cognition:  WNL  Sleep:      Mental Status Per Nursing Assessment::   On Admission:   substance abuse with suicidal ideations  Demographic Factors:  Male and Caucasian  Loss Factors: NA  Historical Factors: NA  Risk Reduction Factors:   Sense of responsibility to family, Employed, Living with another person, especially a relative and Positive social support  Continued Clinical Symptoms:  Irritability, mild  Cognitive Features That Contribute To Risk:  None    Suicide Risk:  Minimal: No identifiable suicidal ideation.  Patients presenting with no risk factors but with morbid ruminations; may be classified as minimal risk based on the severity of the depressive symptoms    Plan Of Care/Follow-up recommendations:  Activity:  as tolerated Diet:  heart healthy diet  Tylisha Danis, NP 06/24/2016, 10:53 AM

## 2016-06-24 NOTE — ED Notes (Signed)
Pt discharged home per MD order. This nurse reviewed discharge summary with pt. Pt verbalizes understanding of discharge summary. Pt denies SI/HI/AVH. Pt signed for personal property and property returned. Pt signed e-signature. Ambulatory off unit.

## 2016-09-02 ENCOUNTER — Emergency Department (HOSPITAL_COMMUNITY)
Admission: EM | Admit: 2016-09-02 | Discharge: 2016-09-03 | Disposition: A | Discharge status: Home / Self Care | Payer: Self-pay | Attending: Emergency Medicine | Admitting: Emergency Medicine

## 2016-09-02 ENCOUNTER — Encounter (HOSPITAL_COMMUNITY): Payer: Self-pay | Admitting: Emergency Medicine

## 2016-09-02 ENCOUNTER — Emergency Department (HOSPITAL_COMMUNITY): Payer: Self-pay

## 2016-09-02 DIAGNOSIS — J45909 Unspecified asthma, uncomplicated: Secondary | ICD-10-CM | POA: Insufficient documentation

## 2016-09-02 DIAGNOSIS — F17210 Nicotine dependence, cigarettes, uncomplicated: Secondary | ICD-10-CM | POA: Insufficient documentation

## 2016-09-02 DIAGNOSIS — J4521 Mild intermittent asthma with (acute) exacerbation: Secondary | ICD-10-CM | POA: Insufficient documentation

## 2016-09-02 DIAGNOSIS — F909 Attention-deficit hyperactivity disorder, unspecified type: Secondary | ICD-10-CM | POA: Insufficient documentation

## 2016-09-02 DIAGNOSIS — Z79899 Other long term (current) drug therapy: Secondary | ICD-10-CM | POA: Insufficient documentation

## 2016-09-02 DIAGNOSIS — Z716 Tobacco abuse counseling: Secondary | ICD-10-CM | POA: Insufficient documentation

## 2016-09-02 HISTORY — DX: Unspecified asthma, uncomplicated: J45.909

## 2016-09-02 MED ORDER — DEXAMETHASONE 4 MG PO TABS
10.0000 mg | ORAL_TABLET | Freq: Once | ORAL | Status: AC
  Administered 2016-09-02: 10 mg via ORAL
  Filled 2016-09-02: qty 3

## 2016-09-02 MED ORDER — ALBUTEROL SULFATE HFA 108 (90 BASE) MCG/ACT IN AERS
6.0000 | INHALATION_SPRAY | Freq: Once | RESPIRATORY_TRACT | Status: AC
  Administered 2016-09-02: 6 via RESPIRATORY_TRACT
  Filled 2016-09-02: qty 6.7

## 2016-09-02 MED ORDER — ALBUTEROL SULFATE (2.5 MG/3ML) 0.083% IN NEBU
2.5000 mg | INHALATION_SOLUTION | Freq: Once | RESPIRATORY_TRACT | Status: AC
  Administered 2016-09-02: 2.5 mg via RESPIRATORY_TRACT
  Filled 2016-09-02: qty 3

## 2016-09-02 MED ORDER — ALBUTEROL SULFATE (2.5 MG/3ML) 0.083% IN NEBU
INHALATION_SOLUTION | RESPIRATORY_TRACT | Status: AC
  Filled 2016-09-02: qty 3

## 2016-09-02 MED ORDER — ALBUTEROL SULFATE (2.5 MG/3ML) 0.083% IN NEBU
5.0000 mg | INHALATION_SOLUTION | Freq: Once | RESPIRATORY_TRACT | Status: AC
  Administered 2016-09-02: 5 mg via RESPIRATORY_TRACT
  Filled 2016-09-02: qty 6

## 2016-09-02 NOTE — ED Provider Notes (Signed)
[facility]PT Provider Note   CSN: [id number] Arrival date & time: 09/02/16  1916  By signing my name below, I, Cynda Acres, attest that this documentation has been prepared under the direction and in the presence of Lavera Guise, MD. Electronically Signed: Cynda Acres, Scribe. 09/02/16. 9:20 PM.  History   Chief Complaint Chief Complaint  Patient presents with  . Shortness of Breath   HPI Comments: Mathew Bailey is a 26 y.o. male with a history of asthma, who presents to the Emergency Department complaining of gradual onset of shortness of breath that began yesterday evening. Patient reports smoking marijuana before going to bed last night, in which he woke up this morning with shortness of breath. Patient states he has never had this problem before after smoking marijuana, but has been smoking cigarettes intermittently. Patient has a history of controlled asthma. Last weekend with wheezing, that resolved after 5 puff albuterol, but then ran out of inhaler.  Patient reports an associated cough and chest "burning". No modifying factors indicated. Patient is a current everyday smoker as well, did not smoke tobacco before bed.Patient denies any chest pain, fever, nausea, vomiting, or diaphoresis.   Patient was given a breathing treatment in the emergency room, in which the patient's breathing has significantly improved.   The history is provided by the patient. No language interpreter was used.    Past Medical History:  Diagnosis Date  . ADHD (attention deficit hyperactivity disorder)   . Anemia   . Asthma   . Cellulitis   . GERD (gastroesophageal reflux disease)   . Thrombocytopenia (HCC)   . Tobacco use     Patient Active Problem List   Diagnosis Date Noted  . Substance induced mood disorder (HCC) 06/24/2016  . Benzodiazepine dependence (HCC)   . Cannabis use disorder, severe, dependence (HCC) 06/23/2016  . Severe episode of recurrent major depressive disorder,  without psychotic features (HCC)   . Opioid use disorder, severe, dependence (HCC) 07/28/2015  . Major depressive disorder, recurrent episode, severe (HCC) 07/28/2015  . Cellulitis of foot, right 02/13/2014  . R Plantar Puncture wound 02/13/2014  . Tobacco abuse 02/13/2014  . Cellulitis 02/13/2014  . Thrombocytopenia, unspecified 01/22/2013    History reviewed. No pertinent surgical history.     Home Medications    Prior to Admission medications   Medication Sig Start Date End Date Taking? Authorizing Provider  albuterol (PROVENTIL HFA;VENTOLIN HFA) 108 (90 Base) MCG/ACT inhaler Inhale 2 puffs into the lungs every 4 (four) hours as needed for wheezing or shortness of breath. 08/01/15  Yes Sanjuana Kava, NP  albuterol (PROVENTIL HFA;VENTOLIN HFA) 108 (90 Base) MCG/ACT inhaler Inhale 2 puffs into the lungs every 4 (four) hours as needed for wheezing or shortness of breath. 09/03/16   Lavera Guise, MD  citalopram (CELEXA) 10 MG tablet Take 1 tablet (10 mg total) by mouth daily. Patient not taking: Reported on 09/02/2016 06/25/16   Charm Rings, NP  dexamethasone (DECADRON) 6 MG tablet Take 2 tablets (12 mg total) by mouth once as needed (wheezing or shortness of breath in 2-3 days). 09/03/16   Lavera Guise, MD  docusate sodium (COLACE) 100 MG capsule Take 1 capsule (100 mg total) by mouth 2 (two) times daily. (May purchase from over the counter at the pharmacy): For Constipation Patient not taking: Reported on 06/22/2016 08/01/15   Sanjuana Kava, NP  hydrOXYzine (ATARAX/VISTARIL) 25 MG tablet Take 1 tablet (25 mg total) by mouth every 6 (  six) hours as needed for anxiety. Patient not taking: Reported on 09/02/2016 06/24/16   Charm Rings, NP  traZODone (DESYREL) 50 MG tablet Take 1 tablet (50 mg total) by mouth at bedtime as needed and may repeat dose one time if needed for sleep. Patient not taking: Reported on 09/02/2016 06/24/16   Charm Rings, NP    Family History No family history on  file.  Social History Social History  Substance Use Topics  . Smoking status: Current Every Day Smoker    Packs/day: 0.50    Years: 10.00    Types: Cigarettes  . Smokeless tobacco: Never Used  . Alcohol use No     Allergies   Other and Latex   Review of Systems Review of Systems  A complete 10 system review of systems was obtained and all systems are negative except as noted in the HPI and PMH.   Physical Exam Updated Vital Signs BP 112/65   Pulse 82   Temp 98.4 F (36.9 C) (Oral)   Resp (!) 22   Ht  (1.778 m)   Wt 156 lb 9 oz (71 kg)   SpO2 91%   BMI 22.46 kg/m   Physical Exam  Physical Exam  Nursing note and vitals reviewed. Constitutional: Well developed, well nourished, non-toxic, and in no acute distress Head: Normocephalic and atraumatic.  Mouth/Throat: Oropharynx is clear and moist.  Neck: Normal range of motion. Neck supple.  Cardiovascular: Normal rate and regular rhythm.   Pulmonary/Chest: Effort normal with few scattered expiratory wheezes.  Abdominal: Soft. There is no tenderness. There is no rebound and no guarding.  Musculoskeletal: Normal range of motion. No lower extremity edema or calf tenderness.  Neurological: Alert, no facial droop, fluent speech, moves all extremities symmetrically Skin: Skin is warm and dry.  Psychiatric: Cooperative   ED Treatments / Results  DIAGNOSTIC STUDIES: Oxygen Saturation is 94% on RA, adequate by my interpretation.    COORDINATION OF CARE: 9:19 PM Discussed treatment plan with pt at bedside and pt agreed to plan, which includes decadron and an inhaler.   Labs (all labs ordered are listed, but only abnormal results are displayed) Labs Reviewed - No data to display  EKG  EKG Interpretation None       Radiology Dg Chest 2 View  Result Date: 09/02/2016 CLINICAL DATA:  Chest pain, productive cough, and shortness of breath for 1 day. Asthma. Current smoker. EXAM: CHEST  2 VIEW COMPARISON:   06/22/2016 FINDINGS: The heart size and mediastinal contours are within normal limits. Both lungs are clear. The visualized skeletal structures are unremarkable. IMPRESSION: Negative.  No active cardiopulmonary disease. Electronically Signed   By: Myles Rosenthal M.D.   On: 09/02/2016 20:04    Procedures Procedures (including critical care time)  Medications Ordered in ED Medications  albuterol (PROVENTIL) (2.5 MG/3ML) 0.083% nebulizer solution 5 mg (5 mg Nebulization Given 09/02/16 2051)  dexamethasone (DECADRON) tablet 10 mg (10 mg Oral Given 09/02/16 2234)  albuterol (PROVENTIL HFA;VENTOLIN HFA) 108 (90 Base) MCG/ACT inhaler 6 puff (6 puffs Inhalation Given 09/02/16 2138)  albuterol (PROVENTIL) (2.5 MG/3ML) 0.083% nebulizer solution 2.5 mg (2.5 mg Nebulization Given 09/02/16 2329)     Initial Impression / Assessment and Plan / ED Course  I have reviewed the triage vital signs and the nursing notes.  Pertinent labs & imaging results that were available during my care of the patient were reviewed by me and considered in my medical decision making (see chart for  details).    Patient was counseled on the cessation of tobacco use with history of asthma. The patient was counseled on the dangers of tobacco use, and was advised to quit.  Reviewed strategies to maximize success, including removing cigarettes and smoking materials from environment.   Presents with acute asthma exacerbation. Received albuterol prior to my evaluation, and feels improved. With scattered wheezes and additional treatment given with dose of decadron. CXR visualized and without pneumonia, edema or other acute cardiopulmonary processes. States he feels back to normal and repeat lung exam improved. Will discharge home with repeat dose decadron and inhaler.     Final Clinical Impressions(s) / ED Diagnoses   Final diagnoses:  Mild intermittent asthma with acute exacerbation  Tobacco abuse counseling    New  Prescriptions Discharge Medication List as of 09/03/2016 12:11 AM    START taking these medications   Details  dexamethasone (DECADRON) 6 MG tablet Take 2 tablets (12 mg total) by mouth once as needed (wheezing or shortness of breath in 2-3 days)., Starting Fri 09/03/2016, Print       I personally performed the services described in this documentation, which was scribed in my presence. The recorded information has been reviewed and is accurate.     Lavera Guise, MD 09/03/16 [phone]

## 2016-09-02 NOTE — ED Triage Notes (Signed)
Pt states that he has a history of asthma and today has been having increased SOB with wheezing and chest tightness

## 2016-09-03 MED ORDER — ALBUTEROL SULFATE HFA 108 (90 BASE) MCG/ACT IN AERS: 2.0000 | INHALATION_SPRAY | RESPIRATORY_TRACT | 3 refills | Status: DC | PRN

## 2016-09-03 MED ORDER — DEXAMETHASONE 6 MG PO TABS: 12.0000 mg | ORAL_TABLET | Freq: Once | ORAL | 0 refills | Status: DC | PRN

## 2016-09-03 NOTE — ED Notes (Signed)
Pt states understanding of care given and follow up instructions.  Pt a/o, ambulated from ED with steady gait  

## 2016-09-03 NOTE — Discharge Instructions (Signed)
For the next day give yourself 6-8 puffs of albuterol every 4 hours scheduled. Been using give herself 2 puffs as needed for shortness of breath. If in 2-3 days you continue to have wheezing and shortness of breath, take your dose of Decadron as prescribed.  Without fail for worsening symptoms, including difficulty breathing, confusion, passing out, or any other symptoms concerning to you.

## 2016-11-01 ENCOUNTER — Encounter (HOSPITAL_COMMUNITY): Payer: Self-pay | Admitting: Emergency Medicine

## 2016-11-01 ENCOUNTER — Emergency Department (HOSPITAL_COMMUNITY)
Admission: EM | Admit: 2016-11-01 | Discharge: 2016-11-01 | Disposition: A | Discharge status: Home / Self Care | Payer: Self-pay | Attending: Emergency Medicine | Admitting: Emergency Medicine

## 2016-11-01 DIAGNOSIS — F909 Attention-deficit hyperactivity disorder, unspecified type: Secondary | ICD-10-CM | POA: Insufficient documentation

## 2016-11-01 DIAGNOSIS — F17210 Nicotine dependence, cigarettes, uncomplicated: Secondary | ICD-10-CM | POA: Insufficient documentation

## 2016-11-01 DIAGNOSIS — J45909 Unspecified asthma, uncomplicated: Secondary | ICD-10-CM | POA: Insufficient documentation

## 2016-11-01 DIAGNOSIS — Z5321 Procedure and treatment not carried out due to patient leaving prior to being seen by health care provider: Secondary | ICD-10-CM | POA: Insufficient documentation

## 2016-11-01 DIAGNOSIS — T7840XA Allergy, unspecified, initial encounter: Secondary | ICD-10-CM | POA: Insufficient documentation

## 2016-11-01 NOTE — ED Triage Notes (Addendum)
Per EMS pt had an allergic reaction shortly before IV drug use about 2015. Pt given 50 benadryl, 125mg  solumedrol, 5 albuterol on EMS. Pt has rash on torso, arms, and legs. Pt stated he used fentanyl about an hour ago.

## 2016-11-01 NOTE — ED Provider Notes (Signed)
23:08 PM Pt seen walking out of his room mumbling, nurses report he left.    Devoria AlbeKnapp, Nelle Sayed, MD 11/01/16 [phone]

## 2016-11-01 NOTE — ED Notes (Signed)
Pt walked out of room cursing, then walked down the hall toward waiting room doors.  Pt's IV found laying on the bed.  Charge nurse notified.

## 2016-12-10 ENCOUNTER — Encounter (HOSPITAL_COMMUNITY): Payer: Self-pay

## 2016-12-10 DIAGNOSIS — F17210 Nicotine dependence, cigarettes, uncomplicated: Secondary | ICD-10-CM | POA: Diagnosis not present

## 2016-12-10 DIAGNOSIS — Z91040 Latex allergy status: Secondary | ICD-10-CM | POA: Diagnosis not present

## 2016-12-10 DIAGNOSIS — J4541 Moderate persistent asthma with (acute) exacerbation: Secondary | ICD-10-CM | POA: Insufficient documentation

## 2016-12-10 DIAGNOSIS — R0602 Shortness of breath: Secondary | ICD-10-CM | POA: Diagnosis present

## 2016-12-10 NOTE — ED Triage Notes (Signed)
Pt states he has run out of his inhaler and states he feels like he still needs one.  Pt states his chest is also tight.

## 2016-12-11 ENCOUNTER — Emergency Department (HOSPITAL_COMMUNITY)
Admission: EM | Admit: 2016-12-11 | Discharge: 2016-12-11 | Disposition: A | Discharge status: Home / Self Care | Payer: BLUE CROSS/BLUE SHIELD | Attending: Emergency Medicine | Admitting: Emergency Medicine

## 2016-12-11 ENCOUNTER — Emergency Department (HOSPITAL_COMMUNITY): Payer: BLUE CROSS/BLUE SHIELD

## 2016-12-11 DIAGNOSIS — F17210 Nicotine dependence, cigarettes, uncomplicated: Secondary | ICD-10-CM

## 2016-12-11 DIAGNOSIS — J4541 Moderate persistent asthma with (acute) exacerbation: Secondary | ICD-10-CM

## 2016-12-11 MED ORDER — ALBUTEROL SULFATE HFA 108 (90 BASE) MCG/ACT IN AERS
2.0000 | INHALATION_SPRAY | RESPIRATORY_TRACT | Status: DC | PRN
  Filled 2016-12-11: qty 6.7

## 2016-12-11 MED ORDER — ALBUTEROL SULFATE HFA 108 (90 BASE) MCG/ACT IN AERS: 2.0000 | INHALATION_SPRAY | RESPIRATORY_TRACT | 0 refills | Status: DC | PRN

## 2016-12-11 MED ORDER — IPRATROPIUM-ALBUTEROL 0.5-2.5 (3) MG/3ML IN SOLN
3.0000 mL | Freq: Once | RESPIRATORY_TRACT | Status: AC
  Administered 2016-12-11: 3 mL via RESPIRATORY_TRACT
  Filled 2016-12-11: qty 3

## 2016-12-11 MED ORDER — PREDNISONE 50 MG PO TABS
60.0000 mg | ORAL_TABLET | Freq: Once | ORAL | Status: AC
  Administered 2016-12-11: 02:00:00 60 mg via ORAL
  Filled 2016-12-11: qty 1

## 2016-12-11 MED ORDER — PREDNISONE 50 MG PO TABS: 50.0000 mg | ORAL_TABLET | Freq: Every day | ORAL | 0 refills | Status: DC

## 2016-12-11 NOTE — ED Notes (Signed)
Respiratory paged

## 2016-12-11 NOTE — ED Notes (Signed)
Pt given crackers upon request. EDP aware

## 2016-12-11 NOTE — ED Provider Notes (Signed)
[facility] Provider Note   CSN: [id number] Arrival date & time: 12/10/16  2317     History   Chief Complaint Chief Complaint  Patient presents with  . Shortness of Breath    HPI Mathew Bailey is a 26 y.o. male.  The history is provided by the patient.  He has a history of asthma and states that he wheezes most of the time. He had an inhaler from several months ago, but it has run out. He had a prescription from several months ago which he was unable to fill because he did not have insurance. In the interim, he has obtained insurance, but pharmacy stated that the prescription was outdated and they could not fill his prescription. He tends to get more short of breath at about 5 AM and wakes up with difficulty breathing. He used his last space from inhaler this evening, did not think he would be to make it through the night. He does have a cough productive of yellowish to brownish sputum. He denies any chest pain, heaviness, tightness, pressure. He denies fever, chills, sweats. He does smoke half pack of cigarettes a day.  Past Medical History:  Diagnosis Date  . ADHD (attention deficit hyperactivity disorder)   . Anemia   . Asthma   . Cellulitis   . GERD (gastroesophageal reflux disease)   . Thrombocytopenia (HCC)   . Tobacco use     Patient Active Problem List   Diagnosis Date Noted  . Substance induced mood disorder (HCC) 06/24/2016  . Benzodiazepine dependence (HCC)   . Cannabis use disorder, severe, dependence (HCC) 06/23/2016  . Severe episode of recurrent major depressive disorder, without psychotic features (HCC)   . Opioid use disorder, severe, dependence (HCC) 07/28/2015  . Major depressive disorder, recurrent episode, severe (HCC) 07/28/2015  . Cellulitis of foot, right 02/13/2014  . R Plantar Puncture wound 02/13/2014  . Tobacco abuse 02/13/2014  . Cellulitis 02/13/2014  . Thrombocytopenia, unspecified (HCC) 01/22/2013    History reviewed. No  pertinent surgical history.     Home Medications    Prior to Admission medications   Medication Sig Start Date End Date Taking? Authorizing Provider  albuterol (PROVENTIL HFA;VENTOLIN HFA) 108 (90 Base) MCG/ACT inhaler Inhale 2 puffs into the lungs every 4 (four) hours as needed for wheezing or shortness of breath. 08/01/15   Armandina Stammer I, NP  albuterol (PROVENTIL HFA;VENTOLIN HFA) 108 (90 Base) MCG/ACT inhaler Inhale 2 puffs into the lungs every 4 (four) hours as needed for wheezing or shortness of breath. 09/03/16   Lavera Guise, MD  citalopram (CELEXA) 10 MG tablet Take 1 tablet (10 mg total) by mouth daily. Patient not taking: Reported on 09/02/2016 06/25/16   Charm Rings, NP  dexamethasone (DECADRON) 6 MG tablet Take 2 tablets (12 mg total) by mouth once as needed (wheezing or shortness of breath in 2-3 days). 09/03/16   Lavera Guise, MD  docusate sodium (COLACE) 100 MG capsule Take 1 capsule (100 mg total) by mouth 2 (two) times daily. (May purchase from over the counter at the pharmacy): For Constipation Patient not taking: Reported on 06/22/2016 08/01/15   Armandina Stammer I, NP  hydrOXYzine (ATARAX/VISTARIL) 25 MG tablet Take 1 tablet (25 mg total) by mouth every 6 (six) hours as needed for anxiety. Patient not taking: Reported on 09/02/2016 06/24/16   Charm Rings, NP  traZODone (DESYREL) 50 MG tablet Take 1 tablet (50 mg total) by mouth at bedtime as needed and  may repeat dose one time if needed for sleep. Patient not taking: Reported on 09/02/2016 06/24/16   Charm Rings, NP    Family History No family history on file.  Social History Social History  Substance Use Topics  . Smoking status: Current Every Day Smoker    Packs/day: 0.50    Years: 10.00    Types: Cigarettes  . Smokeless tobacco: Never Used  . Alcohol use No     Allergies   Nickel; Other; and Latex   Review of Systems Review of Systems  All other systems reviewed and are negative.    Physical  Exam Updated Vital Signs BP 136/84 (BP Location: Left Arm)   Pulse (!) 58   Temp 98.6 F (37 C) (Oral)   Resp 11   Ht 5\' 10"  (1.778 m)   Wt 68 kg (150 lb)   SpO2 100%   BMI 21.52 kg/m   Physical Exam  Nursing note and vitals reviewed.  26 year old male, resting comfortably and in no acute distress. Vital signs are normal. Oxygen saturation is 100%, which is normal. Head is normocephalic and atraumatic. PERRLA, EOMI. Oropharynx is clear. Neck is nontender and supple without adenopathy or JVD. Back is nontender and there is no CVA tenderness. Lungs have diffuse inspiratory and expiratory wheezes without rales or rhonchi. Chest is nontender. Heart has regular rate and rhythm without murmur. Abdomen is soft, flat, nontender without masses or hepatosplenomegaly and peristalsis is normoactive. Extremities have no cyanosis or edema, full range of motion is present. Skin is warm and dry without rash. Neurologic: Mental status is normal, cranial nerves are intact, there are no motor or sensory deficits.  ED Treatments / Results   EKG  EKG Interpretation  Date/Time:  Saturday December 11 2016 01:15:26 EDT Ventricular Rate:  70 PR Interval:    QRS Duration: 97 QT Interval:  371 QTC Calculation: 401 R Axis:   96 Text Interpretation:  Sinus rhythm Borderline right axis deviation When compared with ECG of 09/02/2016, No significant change was found Confirmed by Dione Booze ([id number]) on 12/11/2016 1:20:03 AM       Radiology Dg Chest 2 View  Result Date: 12/11/2016 CLINICAL DATA:  26 year old male with cough and shortness of breath. EXAM: CHEST  2 VIEW COMPARISON:  Chest radiograph dated  09/02/2016 FINDINGS: There is hyperinflation of the lungs likely related to underlying asthma. No focal consolidation, pleural effusion, or pneumothorax. The cardiac silhouette is within normal limits. No acute osseous pathology. IMPRESSION: No active cardiopulmonary disease. Electronically Signed   By:  Elgie Collard M.D.   On: 12/11/2016 02:40    Procedures Procedures (including critical care time)  Medications Ordered in ED Medications  albuterol (PROVENTIL HFA;VENTOLIN HFA) 108 (90 Base) MCG/ACT inhaler 2 puff (not administered)  ipratropium-albuterol (DUONEB) 0.5-2.5 (3) MG/3ML nebulizer solution 3 mL (3 mLs Nebulization Given 12/11/16 0142)  predniSONE (DELTASONE) tablet 60 mg (60 mg Oral Given 12/11/16 0138)  ipratropium-albuterol (DUONEB) 0.5-2.5 (3) MG/3ML nebulizer solution 3 mL (3 mLs Nebulization Given 12/11/16 0350)  ipratropium-albuterol (DUONEB) 0.5-2.5 (3) MG/3ML nebulizer solution 3 mL (3 mLs Nebulization Given 12/11/16 0434)     Initial Impression / Assessment and Plan / ED Course  I have reviewed the triage vital signs and the nursing notes.  Pertinent imaging results that were available during my care of the patient were reviewed by me and considered in my medical decision making (see chart for details).  Exacerbation of asthma. Old records are reviewed confirming  prior ED visits for asthma. He is given dose of prednisone and nebulizer treatment with albuterol and ipratropium. He is sent for chest x-ray to rule out pneumonia.  Chest x-ray is unremarkable. He had moderate improvement with albuterol with ipratropium. He is given a second nebulizer treatment with further improvement, but still some mild, persistent wheezes. He is given a third albuterol with ipratropium nebulizer treatment with almost complete resolution of symptoms. He is discharged with prescription for prednisone and albuterol inhaler. He is encouraged to stop smoking.  Final Clinical Impressions(s) / ED Diagnoses   Final diagnoses:  Moderate persistent asthma with exacerbation  Cigarette smoker    New Prescriptions New Prescriptions   ALBUTEROL (PROVENTIL HFA;VENTOLIN HFA) 108 (90 BASE) MCG/ACT INHALER    Inhale 2 puffs into the lungs every 4 (four) hours as needed for wheezing or shortness of  breath (or coughing).   PREDNISONE (DELTASONE) 50 MG TABLET    Take 1 tablet (50 mg total) by mouth daily.     Dione BoozeGlick, Ivann Trimarco, MD 12/11/16 [phone]

## 2016-12-11 NOTE — ED Notes (Signed)
Per Dr. Preston FleetingGlick, he would like the pt to have a 3rd neb treatment. Respiratory paged.

## 2017-01-07 NOTE — ED Notes (Signed)
[facility] called pt wanting to fill ventolin script . Script is 2 months old. Per Dr Clarene DukeMcmanus may refill

## 2017-02-05 ENCOUNTER — Emergency Department (HOSPITAL_COMMUNITY)
Admission: EM | Admit: 2017-02-05 | Discharge: 2017-02-05 | Disposition: A | Discharge status: Home / Self Care | Payer: BLUE CROSS/BLUE SHIELD | Attending: Emergency Medicine | Admitting: Emergency Medicine

## 2017-02-05 ENCOUNTER — Encounter (HOSPITAL_COMMUNITY): Payer: Self-pay

## 2017-02-05 ENCOUNTER — Emergency Department (HOSPITAL_COMMUNITY): Payer: BLUE CROSS/BLUE SHIELD

## 2017-02-05 DIAGNOSIS — J4520 Mild intermittent asthma, uncomplicated: Secondary | ICD-10-CM | POA: Insufficient documentation

## 2017-02-05 DIAGNOSIS — R0602 Shortness of breath: Secondary | ICD-10-CM | POA: Diagnosis present

## 2017-02-05 DIAGNOSIS — Z79899 Other long term (current) drug therapy: Secondary | ICD-10-CM | POA: Diagnosis not present

## 2017-02-05 DIAGNOSIS — J4521 Mild intermittent asthma with (acute) exacerbation: Secondary | ICD-10-CM

## 2017-02-05 DIAGNOSIS — F17210 Nicotine dependence, cigarettes, uncomplicated: Secondary | ICD-10-CM | POA: Diagnosis not present

## 2017-02-05 DIAGNOSIS — Z91040 Latex allergy status: Secondary | ICD-10-CM | POA: Insufficient documentation

## 2017-02-05 MED ORDER — PREDNISONE 50 MG PO TABS: ORAL_TABLET | ORAL | 0 refills | Status: DC

## 2017-02-05 MED ORDER — PREDNISONE 50 MG PO TABS
60.0000 mg | ORAL_TABLET | Freq: Once | ORAL | Status: DC
  Filled 2017-02-05: qty 1

## 2017-02-05 MED ORDER — ALBUTEROL SULFATE HFA 108 (90 BASE) MCG/ACT IN AERS
2.0000 | INHALATION_SPRAY | Freq: Once | RESPIRATORY_TRACT | Status: AC
  Administered 2017-02-05: 2 via RESPIRATORY_TRACT
  Filled 2017-02-05: qty 6.7

## 2017-02-05 MED ORDER — IPRATROPIUM-ALBUTEROL 0.5-2.5 (3) MG/3ML IN SOLN
3.0000 mL | Freq: Once | RESPIRATORY_TRACT | Status: AC
  Administered 2017-02-05: 3 mL via RESPIRATORY_TRACT
  Filled 2017-02-05: qty 3

## 2017-02-05 MED ORDER — ALBUTEROL SULFATE HFA 108 (90 BASE) MCG/ACT IN AERS: 2.0000 | INHALATION_SPRAY | Freq: Four times a day (QID) | RESPIRATORY_TRACT | 0 refills | Status: DC | PRN

## 2017-02-05 NOTE — ED Notes (Signed)
Pt given crackers & coke

## 2017-02-05 NOTE — ED Provider Notes (Signed)
[facility] DEPT Provider Note   CSN: [id number] Arrival date & time: 02/05/17  0144     History   Chief Complaint Chief Complaint  Patient presents with  . Shortness of Breath    wants inhaler    HPI Mathew Bailey is a 26 y.o. male.  Patient with history of asthma states he is out of his inhaler. Had increased SOB tonight. Normally uses albuterol 4-5 x daily. Continues to smoke. Cough productive of white mucus. No chest pain or fever. Takes no other asthma medication. Just "wants an inhaler so I can go home."   The history is provided by the patient.    Past Medical History:  Diagnosis Date  . ADHD (attention deficit hyperactivity disorder)   . Anemia   . Asthma   . Cellulitis   . GERD (gastroesophageal reflux disease)   . Thrombocytopenia (HCC)   . Tobacco use     Patient Active Problem List   Diagnosis Date Noted  . Substance induced mood disorder (HCC) 06/24/2016  . Benzodiazepine dependence (HCC)   . Cannabis use disorder, severe, dependence (HCC) 06/23/2016  . Severe episode of recurrent major depressive disorder, without psychotic features (HCC)   . Opioid use disorder, severe, dependence (HCC) 07/28/2015  . Major depressive disorder, recurrent episode, severe (HCC) 07/28/2015  . Cellulitis of foot, right 02/13/2014  . R Plantar Puncture wound 02/13/2014  . Tobacco abuse 02/13/2014  . Cellulitis 02/13/2014  . Thrombocytopenia, unspecified (HCC) 01/22/2013    History reviewed. No pertinent surgical history.     Home Medications    Prior to Admission medications   Medication Sig Start Date End Date Taking? Authorizing Provider  albuterol (PROVENTIL HFA;VENTOLIN HFA) 108 (90 Base) MCG/ACT inhaler Inhale 2 puffs into the lungs every 4 (four) hours as needed for wheezing or shortness of breath. 08/01/15  Yes Armandina Stammer I, NP  albuterol (PROVENTIL HFA;VENTOLIN HFA) 108 (90 Base) MCG/ACT inhaler Inhale 2 puffs into the lungs every 4 (four) hours as  needed for wheezing or shortness of breath. 09/03/16   Lavera Guise, MD  albuterol (PROVENTIL HFA;VENTOLIN HFA) 108 (90 Base) MCG/ACT inhaler Inhale 2 puffs into the lungs every 4 (four) hours as needed for wheezing or shortness of breath (or coughing). 12/11/16   Dione Booze, MD  dexamethasone (DECADRON) 6 MG tablet Take 2 tablets (12 mg total) by mouth once as needed (wheezing or shortness of breath in 2-3 days). 09/03/16   Lavera Guise, MD  predniSONE (DELTASONE) 50 MG tablet Take 1 tablet (50 mg total) by mouth daily. 12/11/16   Dione Booze, MD    Family History No family history on file.  Social History Social History  Substance Use Topics  . Smoking status: Current Every Day Smoker    Packs/day: 0.50    Years: 10.00    Types: Cigarettes  . Smokeless tobacco: Never Used  . Alcohol use No     Allergies   Nickel; Other; and Latex   Review of Systems Review of Systems  Constitutional: Negative for activity change, appetite change and fatigue.  HENT: Positive for congestion.   Respiratory: Positive for cough. Negative for shortness of breath.   Cardiovascular: Negative for chest pain and leg swelling.  Gastrointestinal: Negative for abdominal distention, diarrhea, nausea and vomiting.  Genitourinary: Negative for dysuria and hematuria.  Neurological: Negative for dizziness, weakness and headaches.    all other systems are negative except as noted in the HPI and PMH.    Physical  Exam Updated Vital Signs BP (!) 127/59 (BP Location: Right Arm)   Pulse 82   Temp 98.5 F (36.9 C) (Oral)   Resp 15   Ht 5\' 11"  (1.803 m)   Wt 71.7 kg (158 lb)   SpO2 96%   BMI 22.04 kg/m   Physical Exam  Constitutional: He is oriented to person, place, and time. He appears well-developed and well-nourished. No distress.  Speaking in full sentences  HENT:  Head: Normocephalic and atraumatic.  Mouth/Throat: Oropharynx is clear and moist. No oropharyngeal exudate.  Eyes: Pupils are equal,  round, and reactive to light. Conjunctivae and EOM are normal.  Neck: Normal range of motion. Neck supple.  No meningismus.  Cardiovascular: Normal rate, regular rhythm, normal heart sounds and intact distal pulses.   No murmur heard. Pulmonary/Chest: Effort normal. No respiratory distress. He has wheezes.  Scattered expiratory wheezing  Abdominal: Soft. There is no tenderness. There is no rebound and no guarding.  Musculoskeletal: Normal range of motion. He exhibits no edema or tenderness.  Neurological: He is alert and oriented to person, place, and time. No cranial nerve deficit. He exhibits normal muscle tone. Coordination normal.  No ataxia on finger to nose bilaterally. No pronator drift. 5/5 strength throughout. CN 2-12 intact.Equal grip strength. Sensation intact.   Skin: Skin is warm.  Psychiatric: He has a normal mood and affect. His behavior is normal.  Nursing note and vitals reviewed.    ED Treatments / Results  Labs (all labs ordered are listed, but only abnormal results are displayed) Labs Reviewed - No data to display  EKG  EKG Interpretation None       Radiology No results found.  Procedures Procedures (including critical care time)  Medications Ordered in ED Medications  predniSONE (DELTASONE) tablet 60 mg (60 mg Oral Refused 02/05/17 0437)  ipratropium-albuterol (DUONEB) 0.5-2.5 (3) MG/3ML nebulizer solution 3 mL (3 mLs Nebulization Given 02/05/17 0456)  albuterol (PROVENTIL HFA;VENTOLIN HFA) 108 (90 Base) MCG/ACT inhaler 2 puff (2 puffs Inhalation Given 02/05/17 0452)     Initial Impression / Assessment and Plan / ED Course  I have reviewed the triage vital signs and the nursing notes.  Pertinent labs & imaging results that were available during my care of the patient were reviewed by me and considered in my medical decision making (see chart for details).     Asthma, with coughing and SOB, requesting inhaler. No distress or hypoxia.  Neb given in  ED.   Patient refuses chest x-ray. Refuses prednisone.  After nebulizer, patient is improved with minimal wheezing. He wishes to go home. States he will not take steroids even if they are prescribed.  Smoking cessation is encouraged. Prednisone course encouraged.  PCP followup. Return precautions discussed. Final Clinical Impressions(s) / ED Diagnoses   Final diagnoses:  Mild intermittent asthma with exacerbation    New Prescriptions New Prescriptions   No medications on file     Glynn Octaveancour, Raijon Lindfors, MD 02/05/17 [phone]

## 2017-02-05 NOTE — ED Notes (Signed)
Pt refused Chest xray and prednisone.

## 2017-03-19 ENCOUNTER — Encounter (HOSPITAL_COMMUNITY): Payer: Self-pay

## 2017-03-19 ENCOUNTER — Emergency Department (HOSPITAL_COMMUNITY)
Admission: EM | Admit: 2017-03-19 | Discharge: 2017-03-19 | Disposition: A | Discharge status: Home / Self Care | Payer: BLUE CROSS/BLUE SHIELD | Attending: Emergency Medicine | Admitting: Emergency Medicine

## 2017-03-19 DIAGNOSIS — R062 Wheezing: Secondary | ICD-10-CM | POA: Diagnosis present

## 2017-03-19 DIAGNOSIS — F17210 Nicotine dependence, cigarettes, uncomplicated: Secondary | ICD-10-CM | POA: Insufficient documentation

## 2017-03-19 DIAGNOSIS — J4521 Mild intermittent asthma with (acute) exacerbation: Secondary | ICD-10-CM | POA: Insufficient documentation

## 2017-03-19 DIAGNOSIS — Z79899 Other long term (current) drug therapy: Secondary | ICD-10-CM | POA: Insufficient documentation

## 2017-03-19 MED ORDER — PREDNISONE 50 MG PO TABS
60.0000 mg | ORAL_TABLET | Freq: Once | ORAL | Status: AC
  Administered 2017-03-19: 60 mg via ORAL
  Filled 2017-03-19: qty 1

## 2017-03-19 MED ORDER — PREDNISONE 50 MG PO TABS: ORAL_TABLET | ORAL | 0 refills | Status: DC

## 2017-03-19 MED ORDER — IPRATROPIUM-ALBUTEROL 0.5-2.5 (3) MG/3ML IN SOLN
3.0000 mL | Freq: Once | RESPIRATORY_TRACT | Status: AC
  Administered 2017-03-19: 3 mL via RESPIRATORY_TRACT
  Filled 2017-03-19: qty 3

## 2017-03-19 MED ORDER — ALBUTEROL SULFATE HFA 108 (90 BASE) MCG/ACT IN AERS
2.0000 | INHALATION_SPRAY | Freq: Four times a day (QID) | RESPIRATORY_TRACT | 0 refills | Status: DC | PRN
Start: 2017-03-19 — End: 2017-05-08

## 2017-03-19 MED ORDER — ALBUTEROL SULFATE HFA 108 (90 BASE) MCG/ACT IN AERS
2.0000 | INHALATION_SPRAY | Freq: Once | RESPIRATORY_TRACT | Status: DC
  Filled 2017-03-19: qty 6.7

## 2017-03-19 NOTE — ED Provider Notes (Signed)
[facility]   CSN: [id number] Arrival date & time: 03/19/17  0120     History   Chief Complaint Chief Complaint  Patient presents with  . Wheezing    HPI Mathew Bailey is a 26 y.o. male.  Patient with history of asthma presenting with 2-day history of cough, congestion, sore throat and cough productive of clear and green mucus.  He continues to smoke.  He is out of his inhaler since yesterday.  Normally uses his inhaler once a day.  Denies any fever.  Has chest tightness due to coughing.  Denies any abdominal pain, nausea or vomiting.  Patient also smokes marijuana.  States he has never stayed in the hospital overnight for his asthma.  Does not have a doctor.   The history is provided by the patient.  Wheezing   Associated symptoms include rhinorrhea and cough. Pertinent negatives include no chest pain, no fever, no abdominal pain, no vomiting, no dysuria, no headaches and no rash.    Past Medical History:  Diagnosis Date  . ADHD (attention deficit hyperactivity disorder)   . Anemia   . Asthma   . Cellulitis   . GERD (gastroesophageal reflux disease)   . Thrombocytopenia (HCC)   . Tobacco use     Patient Active Problem List   Diagnosis Date Noted  . Substance induced mood disorder (HCC) 06/24/2016  . Benzodiazepine dependence (HCC)   . Cannabis use disorder, severe, dependence (HCC) 06/23/2016  . Severe episode of recurrent major depressive disorder, without psychotic features (HCC)   . Opioid use disorder, severe, dependence (HCC) 07/28/2015  . Major depressive disorder, recurrent episode, severe (HCC) 07/28/2015  . Cellulitis of foot, right 02/13/2014  . R Plantar Puncture wound 02/13/2014  . Tobacco abuse 02/13/2014  . Cellulitis 02/13/2014  . Thrombocytopenia, unspecified (HCC) 01/22/2013    History reviewed. No pertinent surgical history.     Home Medications    Prior to Admission medications   Medication Sig  Start Date End Date Taking? Authorizing Provider  albuterol (PROVENTIL HFA;VENTOLIN HFA) 108 (90 Base) MCG/ACT inhaler Inhale 2 puffs into the lungs every 6 (six) hours as needed. 02/05/17  Yes Skarlette Lattner, Jeannett SeniorStephen, MD  dexamethasone (DECADRON) 6 MG tablet Take 2 tablets (12 mg total) by mouth once as needed (wheezing or shortness of breath in 2-3 days). 09/03/16   Lavera GuiseLiu, Dana Duo, MD  predniSONE (DELTASONE) 50 MG tablet 1 tablet PO daily 02/05/17   Glynn Octaveancour, Keayra Graham, MD    Family History No family history on file.  Social History Social History  Substance Use Topics  . Smoking status: Current Every Day Smoker    Packs/day: 0.50    Years: 10.00    Types: Cigarettes  . Smokeless tobacco: Never Used  . Alcohol use No     Allergies   Nickel; Other; and Latex   Review of Systems Review of Systems  Constitutional: Negative for activity change, appetite change and fever.  HENT: Positive for congestion and rhinorrhea.   Respiratory: Positive for cough, chest tightness, shortness of breath and wheezing.   Cardiovascular: Negative for chest pain.  Gastrointestinal: Negative for abdominal pain, nausea and vomiting.  Genitourinary: Negative for dysuria, hematuria and testicular pain.  Musculoskeletal: Negative for arthralgias and myalgias.  Skin: Negative for rash.  Neurological: Negative for dizziness, weakness and headaches.   all other systems are negative except as noted in the HPI and PMH.     Physical Exam Updated Vital Signs BP Marland Kitchen(!)  146/88 (BP Location: Right Arm)   Pulse 74   Temp 98.1 F (36.7 C) (Oral)   Resp (!) 22   Ht 6' (1.829 m)   Wt 74.4 kg (164 lb)   SpO2 93%   BMI 22.24 kg/m   Physical Exam  Constitutional: He is oriented to person, place, and time. He appears well-developed and well-nourished. No distress.  Speaking in full sentences  HENT:  Head: Normocephalic and atraumatic.  Mouth/Throat: Oropharynx is clear and moist. No oropharyngeal exudate.  Eyes: Pupils  are equal, round, and reactive to light. Conjunctivae and EOM are normal.  Neck: Normal range of motion. Neck supple.  No meningismus.  Cardiovascular: Normal rate, regular rhythm, normal heart sounds and intact distal pulses.   No murmur heard. Pulmonary/Chest: Effort normal. No respiratory distress. He has wheezes.  Inspiratory and expiratory wheezing  Abdominal: Soft. There is no tenderness. There is no rebound and no guarding.  Musculoskeletal: Normal range of motion. He exhibits no edema or tenderness.  Neurological: He is alert and oriented to person, place, and time. No cranial nerve deficit. He exhibits normal muscle tone. Coordination normal.   5/5 strength throughout. CN 2-12 intact.Equal grip strength.   Skin: Skin is warm.  Psychiatric: He has a normal mood and affect. His behavior is normal.  Nursing note and vitals reviewed.    ED Treatments / Results  Labs (all labs ordered are listed, but only abnormal results are displayed) Labs Reviewed - No data to display  EKG  EKG Interpretation None       Radiology No results found.  Procedures Procedures (including critical care time)  Medications Ordered in ED Medications  ipratropium-albuterol (DUONEB) 0.5-2.5 (3) MG/3ML nebulizer solution 3 mL (not administered)  predniSONE (DELTASONE) tablet 60 mg (60 mg Oral Given 03/19/17 0221)     Initial Impression / Assessment and Plan / ED Course  I have reviewed the triage vital signs and the nursing notes.  Pertinent labs & imaging results that were available during my care of the patient were reviewed by me and considered in my medical decision making (see chart for details).    Asthmatic with difficulty breathing, wheezing, cough and congestion.  He is here requesting a breathing treatment.  He declines a chest x-ray.    Patient given nebulizer and steroids.  Denies any chest pain.  He is requesting codeine cough syrup which does not appear to be indicated.    Patient improved after 2 nebulizers and p.o. steroids.  He is requesting discharge home.  He is asking for an inhaler which was provided.  Discussed with patient he needs to stop smoking.  Needs to establish care with PCP doctor.  Will discharge on prednisone course, inhaler refilled.  Return precautions discussed.  Final Clinical Impressions(s) / ED Diagnoses   Final diagnoses:  Mild intermittent asthma with exacerbation    New Prescriptions New Prescriptions   No medications on file     Glynn Octave, MD 03/19/17 [phone]

## 2017-03-19 NOTE — ED Notes (Signed)
Respiratory called and notified about the nebulizer treatment

## 2017-03-19 NOTE — ED Triage Notes (Signed)
Pt reports productive cough with thick dark sputum, also c/o sore throat, using albuterol inhaler but has run out.

## 2017-03-19 NOTE — Discharge Instructions (Signed)
Use the steroids and inhaler as prescribed.  Stop smoking.  Establish care with a primary care doctor.  Return to the ED if you develop new or worsening symptoms.

## 2017-05-07 ENCOUNTER — Emergency Department (HOSPITAL_COMMUNITY)
Admission: EM | Admit: 2017-05-07 | Discharge: 2017-05-08 | Disposition: A | Discharge status: Home / Self Care | Payer: BLUE CROSS/BLUE SHIELD | Attending: Emergency Medicine | Admitting: Emergency Medicine

## 2017-05-07 ENCOUNTER — Encounter (HOSPITAL_COMMUNITY): Payer: Self-pay | Admitting: Emergency Medicine

## 2017-05-07 DIAGNOSIS — Z91040 Latex allergy status: Secondary | ICD-10-CM | POA: Diagnosis not present

## 2017-05-07 DIAGNOSIS — J4521 Mild intermittent asthma with (acute) exacerbation: Secondary | ICD-10-CM | POA: Diagnosis not present

## 2017-05-07 DIAGNOSIS — F329 Major depressive disorder, single episode, unspecified: Secondary | ICD-10-CM | POA: Insufficient documentation

## 2017-05-07 DIAGNOSIS — R062 Wheezing: Secondary | ICD-10-CM | POA: Diagnosis present

## 2017-05-07 DIAGNOSIS — F1120 Opioid dependence, uncomplicated: Secondary | ICD-10-CM | POA: Insufficient documentation

## 2017-05-07 DIAGNOSIS — F17210 Nicotine dependence, cigarettes, uncomplicated: Secondary | ICD-10-CM | POA: Diagnosis not present

## 2017-05-07 DIAGNOSIS — F1220 Cannabis dependence, uncomplicated: Secondary | ICD-10-CM | POA: Insufficient documentation

## 2017-05-07 DIAGNOSIS — F909 Attention-deficit hyperactivity disorder, unspecified type: Secondary | ICD-10-CM | POA: Diagnosis not present

## 2017-05-07 NOTE — ED Triage Notes (Signed)
Pt states that he "just needs an inhaler." He states he doesn't have any of his left and "does not want it to snow and have an asthma attack."

## 2017-05-08 MED ORDER — PREDNISONE 20 MG PO TABS: 40.0000 mg | ORAL_TABLET | Freq: Every day | ORAL | 0 refills | Status: DC

## 2017-05-08 MED ORDER — IPRATROPIUM-ALBUTEROL 0.5-2.5 (3) MG/3ML IN SOLN
3.0000 mL | Freq: Once | RESPIRATORY_TRACT | Status: AC
Start: 2017-05-08 — End: 2017-05-08
  Administered 2017-05-08: 3 mL via RESPIRATORY_TRACT
  Filled 2017-05-08: qty 3

## 2017-05-08 MED ORDER — ALBUTEROL SULFATE (2.5 MG/3ML) 0.083% IN NEBU
2.5000 mg | INHALATION_SOLUTION | Freq: Once | RESPIRATORY_TRACT | Status: AC
  Administered 2017-05-08: 2.5 mg via RESPIRATORY_TRACT
  Filled 2017-05-08: qty 3

## 2017-05-08 MED ORDER — ALBUTEROL SULFATE HFA 108 (90 BASE) MCG/ACT IN AERS
2.0000 | INHALATION_SPRAY | Freq: Once | RESPIRATORY_TRACT | Status: AC
  Administered 2017-05-08: 2 via RESPIRATORY_TRACT
  Filled 2017-05-08: qty 6.7

## 2017-05-08 MED ORDER — ALBUTEROL SULFATE (2.5 MG/3ML) 0.083% IN NEBU: 2.5000 mg | INHALATION_SOLUTION | Freq: Four times a day (QID) | RESPIRATORY_TRACT | 1 refills | Status: DC | PRN

## 2017-05-08 NOTE — Discharge Instructions (Signed)
Medications as directed.  Follow-up with your doctor for recheck.

## 2017-05-08 NOTE — ED Notes (Signed)
Called respiratory for pt

## 2017-05-08 NOTE — ED Notes (Signed)
Pt alert & oriented x4, stable gait. Patient given discharge instructions, paperwork & prescription(s). Patient  instructed to stop at the registration desk to finish any additional paperwork. Patient verbalized understanding. Pt left department w/ no further questions. 

## 2017-05-08 NOTE — ED Provider Notes (Signed)
[facility]   CSN: [id number] Arrival date & time: 05/07/17  2242     History   Chief Complaint Chief Complaint  Patient presents with  . Medication Refill    HPI Mathew Bailey is a 26 y.o. male.  HPI   Mathew Bailey is a 26 y.o. male who presents to the Emergency Department complaining of wheezing and out of his albuterol inhaler since yesterday.  States that he uses his inhaler 3 times a day and that usually keeps his asthma controlled.  He tried to see his PCP yesterday, but the office had closed before he could get there.  He denies other symptoms, chest pain, fever, cough or nasal congestion.    Past Medical History:  Diagnosis Date  . ADHD (attention deficit hyperactivity disorder)   . Anemia   . Asthma   . Cellulitis   . GERD (gastroesophageal reflux disease)   . Thrombocytopenia (HCC)   . Tobacco use     Patient Active Problem List   Diagnosis Date Noted  . Substance induced mood disorder (HCC) 06/24/2016  . Benzodiazepine dependence (HCC)   . Cannabis use disorder, severe, dependence (HCC) 06/23/2016  . Severe episode of recurrent major depressive disorder, without psychotic features (HCC)   . Opioid use disorder, severe, dependence (HCC) 07/28/2015  . Major depressive disorder, recurrent episode, severe (HCC) 07/28/2015  . Cellulitis of foot, right 02/13/2014  . R Plantar Puncture wound 02/13/2014  . Tobacco abuse 02/13/2014  . Cellulitis 02/13/2014  . Thrombocytopenia, unspecified (HCC) 01/22/2013    History reviewed. No pertinent surgical history.     Home Medications    Prior to Admission medications   Medication Sig Start Date End Date Taking? Authorizing Provider  albuterol (PROVENTIL HFA;VENTOLIN HFA) 108 (90 Base) MCG/ACT inhaler Inhale 2 puffs into the lungs every 6 (six) hours as needed. 03/19/17   Rancour, Jeannett SeniorStephen, MD  dexamethasone (DECADRON) 6 MG tablet Take 2 tablets (12 mg total) by mouth once  as needed (wheezing or shortness of breath in 2-3 days). 09/03/16   Lavera GuiseLiu, Dana Duo, MD  predniSONE (DELTASONE) 50 MG tablet 1 tablet PO daily 03/19/17   Glynn Octaveancour, Stephen, MD    Family History No family history on file.  Social History Social History   Tobacco Use  . Smoking status: Current Every Day Smoker    Packs/day: 0.50    Years: 10.00    Pack years: 5.00    Types: Cigarettes  . Smokeless tobacco: Never Used  Substance Use Topics  . Alcohol use: No  . Drug use: Yes    Types: IV    Comment: Mariiuana, Cocaine. Daily use except cocaine is once a month. Street drugs.      Allergies   Nickel; Other; and Latex   Review of Systems Review of Systems  Constitutional: Negative for appetite change, chills and fever.  HENT: Negative for congestion, sore throat and trouble swallowing.   Respiratory: Positive for wheezing. Negative for cough, chest tightness and shortness of breath.   Cardiovascular: Negative for chest pain.  Gastrointestinal: Negative for abdominal pain, nausea and vomiting.  Genitourinary: Negative for dysuria.  Musculoskeletal: Negative for arthralgias.  Skin: Negative for rash.  Neurological: Negative for dizziness, weakness and numbness.  Hematological: Negative for adenopathy.  All other systems reviewed and are negative.    Physical Exam Updated Vital Signs BP 140/88   Pulse 87   Temp 98.3 F (36.8 C) (Oral)   Resp 18  Ht 6' (1.829 m)   Wt 74.4 kg (164 lb)   SpO2 96%   BMI 22.24 kg/m   Physical Exam  Constitutional: He is oriented to person, place, and time. He appears well-developed and well-nourished. No distress.  HENT:  Head: Normocephalic and atraumatic.  Right Ear: Tympanic membrane and ear canal normal.  Left Ear: Tympanic membrane and ear canal normal.  Mouth/Throat: Uvula is midline, oropharynx is clear and moist and mucous membranes are normal. No oropharyngeal exudate.  Eyes: EOM are normal. Pupils are equal, round, and  reactive to light.  Neck: Normal range of motion, full passive range of motion without pain and phonation normal. Neck supple.  Cardiovascular: Normal rate, regular rhythm and intact distal pulses.  No murmur heard. Pulmonary/Chest: Effort normal. No stridor. No respiratory distress. He has wheezes. He has no rales. He exhibits no tenderness.  Few expiratory wheezes bilaterally.  No rales.  Able to speak in complete sentences w/o distress.   Musculoskeletal: Normal range of motion. He exhibits no edema.  Lymphadenopathy:    He has no cervical adenopathy.  Neurological: He is alert and oriented to person, place, and time. No sensory deficit. He exhibits normal muscle tone. Coordination normal.  Skin: Skin is warm and dry. Capillary refill takes less than 2 seconds.  Nursing note and vitals reviewed.    ED Treatments / Results  Labs (all labs ordered are listed, but only abnormal results are displayed) Labs Reviewed - No data to display  EKG  EKG Interpretation None       Radiology No results found.  Procedures Procedures (including critical care time)  Medications Ordered in ED Medications  albuterol (PROVENTIL) (2.5 MG/3ML) 0.083% nebulizer solution 2.5 mg (2.5 mg Nebulization Given 05/08/17 0057)  ipratropium-albuterol (DUONEB) 0.5-2.5 (3) MG/3ML nebulizer solution 3 mL (3 mLs Nebulization Given 05/08/17 0057)  albuterol (PROVENTIL HFA;VENTOLIN HFA) 108 (90 Base) MCG/ACT inhaler 2 puff (2 puffs Inhalation Given 05/08/17 0057)     Initial Impression / Assessment and Plan / ED Course  I have reviewed the triage vital signs and the nursing notes.  Pertinent labs & imaging results that were available during my care of the patient were reviewed by me and considered in my medical decision making (see chart for details).     Pt has albuterol nebulizer machine at home, requests pipe and tubing to go home with.  I will dispense albuterol MDI and prescription for the vials along  with prednisone.  Lung sounds improved after neb.  He is otherwise well appearing and stable for d/c  Final Clinical Impressions(s) / ED Diagnoses   Final diagnoses:  Mild intermittent asthma with acute exacerbation    ED Discharge Orders    None       Rosey Bathriplett, Crysten Kaman, PA-C 05/08/17 0119    Dione BoozeGlick, David, MD 05/08/17 [phone]

## 2017-05-31 ENCOUNTER — Encounter (HOSPITAL_COMMUNITY): Payer: Self-pay | Admitting: *Deleted

## 2017-05-31 ENCOUNTER — Emergency Department (HOSPITAL_COMMUNITY)
Admission: EM | Admit: 2017-05-31 | Discharge: 2017-06-01 | Disposition: A | Discharge status: Home / Self Care | Payer: BLUE CROSS/BLUE SHIELD | Attending: Emergency Medicine | Admitting: Emergency Medicine

## 2017-05-31 ENCOUNTER — Other Ambulatory Visit: Payer: Self-pay

## 2017-05-31 DIAGNOSIS — R0602 Shortness of breath: Secondary | ICD-10-CM | POA: Diagnosis present

## 2017-05-31 DIAGNOSIS — F17210 Nicotine dependence, cigarettes, uncomplicated: Secondary | ICD-10-CM | POA: Diagnosis not present

## 2017-05-31 DIAGNOSIS — J4521 Mild intermittent asthma with (acute) exacerbation: Secondary | ICD-10-CM | POA: Diagnosis not present

## 2017-05-31 DIAGNOSIS — Z91040 Latex allergy status: Secondary | ICD-10-CM | POA: Insufficient documentation

## 2017-05-31 MED ORDER — ALBUTEROL SULFATE HFA 108 (90 BASE) MCG/ACT IN AERS
1.0000 | INHALATION_SPRAY | Freq: Once | RESPIRATORY_TRACT | Status: AC
  Administered 2017-05-31: 2 via RESPIRATORY_TRACT
  Filled 2017-05-31: qty 6.7

## 2017-05-31 MED ORDER — PREDNISONE 50 MG PO TABS: 50.0000 mg | ORAL_TABLET | Freq: Every day | ORAL | 0 refills | Status: DC

## 2017-05-31 MED ORDER — PREDNISONE 50 MG PO TABS
50.0000 mg | ORAL_TABLET | Freq: Once | ORAL | Status: AC
  Administered 2017-05-31: 50 mg via ORAL
  Filled 2017-05-31: qty 1

## 2017-05-31 MED ORDER — IPRATROPIUM-ALBUTEROL 0.5-2.5 (3) MG/3ML IN SOLN
3.0000 mL | Freq: Once | RESPIRATORY_TRACT | Status: AC
  Administered 2017-05-31: 3 mL via RESPIRATORY_TRACT
  Filled 2017-05-31: qty 3

## 2017-05-31 NOTE — ED Triage Notes (Signed)
Pt c/o sob and states he has ran out of his inhaler

## 2017-05-31 NOTE — Discharge Instructions (Signed)
Take steroid for the next 4 days Use inhaler as needed Establish care with PCP

## 2017-05-31 NOTE — ED Notes (Signed)
[facility] as well as the free clinic.

## 2017-05-31 NOTE — ED Provider Notes (Signed)
[facility] EMERGENCY DEPARTMENT Provider Note   CSN: [id number] Arrival date & time: 05/31/17  2224    History   Chief Complaint Chief Complaint  Patient presents with  . Shortness of Breath    HPI Mathew Bailey is a 27 y.o. male who presents with shortness of breath and wheezing.  Past medical history significant for tobacco abuse.  He states that he has been smoking since he was 27 years old however over the past several years he has had increasing difficulty with shortness of breath and wheezing after he smokes.  He denies history of asthma as a child.  He continues to smoke but states that he is trying to cut back.  He does not have a PCP at this time because they moved.  He ran out of his albuterol inhaler and feels too short of breath to go to sleep.  No fever, URI symptoms, cough. He states he usually is prescribed steroids but never takes them.  HPI  Past Medical History:  Diagnosis Date  . ADHD (attention deficit hyperactivity disorder)   . Anemia   . Asthma   . Cellulitis   . GERD (gastroesophageal reflux disease)   . Thrombocytopenia (HCC)   . Tobacco use     Patient Active Problem List   Diagnosis Date Noted  . Substance induced mood disorder (HCC) 06/24/2016  . Benzodiazepine dependence (HCC)   . Cannabis use disorder, severe, dependence (HCC) 06/23/2016  . Severe episode of recurrent major depressive disorder, without psychotic features (HCC)   . Opioid use disorder, severe, dependence (HCC) 07/28/2015  . Major depressive disorder, recurrent episode, severe (HCC) 07/28/2015  . Cellulitis of foot, right 02/13/2014  . R Plantar Puncture wound 02/13/2014  . Tobacco abuse 02/13/2014  . Cellulitis 02/13/2014  . Thrombocytopenia, unspecified (HCC) 01/22/2013    History reviewed. No pertinent surgical history.     Home Medications    Prior to Admission medications   Medication Sig Start Date End Date Taking? Authorizing Provider  albuterol (PROVENTIL)  (2.5 MG/3ML) 0.083% nebulizer solution Take 3 mLs (2.5 mg total) by nebulization every 6 (six) hours as needed for wheezing or shortness of breath. 05/08/17   Triplett, Tammy, PA-C  dexamethasone (DECADRON) 6 MG tablet Take 2 tablets (12 mg total) by mouth once as needed (wheezing or shortness of breath in 2-3 days). 09/03/16   Lavera Guise, MD  predniSONE (DELTASONE) 20 MG tablet Take 2 tablets (40 mg total) by mouth daily. 05/08/17   Pauline Aus, PA-C    Family History History reviewed. No pertinent family history.  Social History Social History   Tobacco Use  . Smoking status: Current Every Day Smoker    Packs/day: 0.50    Years: 10.00    Pack years: 5.00    Types: Cigarettes  . Smokeless tobacco: Never Used  Substance Use Topics  . Alcohol use: No  . Drug use: Yes    Types: IV    Comment: [place], Cocaine. Daily use except cocaine is once a month. Street drugs.      Allergies   Nickel; Other; and Latex   Review of Systems Review of Systems  Constitutional: Negative for fever.  HENT: Negative for congestion.   Respiratory: Positive for shortness of breath and wheezing.      Physical Exam Updated Vital Signs BP 106/84 (BP Location: Right Arm)   Pulse 97   Temp 98.5 F (36.9 C) (Oral)   Resp 20   Ht 5\' 11"  (  1.803 m)   Wt 70.3 kg (155 lb)   SpO2 93%   BMI 21.62 kg/m   Physical Exam  Constitutional: He is oriented to person, place, and time. He appears well-developed and well-nourished. No distress.  HENT:  Head: Normocephalic and atraumatic.  Right Ear: Hearing, tympanic membrane, external ear and ear canal normal.  Left Ear: Hearing, tympanic membrane, external ear and ear canal normal.  Nose: Nose normal.  Mouth/Throat: Uvula is midline, oropharynx is clear and moist and mucous membranes are normal.  Eyes: Conjunctivae are normal. Pupils are equal, round, and reactive to light. Right eye exhibits no discharge. Left eye exhibits no discharge. No scleral  icterus.  Neck: Normal range of motion.  Cardiovascular: Normal rate and regular rhythm. Exam reveals no gallop and no friction rub.  No murmur heard. Pulmonary/Chest: Effort normal. No stridor. No respiratory distress. He has wheezes (diffuse inspiratory and expiratory). He has no rales. He exhibits no tenderness.  Abdominal: He exhibits no distension.  Neurological: He is alert and oriented to person, place, and time.  Skin: Skin is warm and dry.  Psychiatric: He has a normal mood and affect. His behavior is normal.  Nursing note and vitals reviewed.    ED Treatments / Results  Labs (all labs ordered are listed, but only abnormal results are displayed) Labs Reviewed - No data to display  EKG  EKG Interpretation None       Radiology No results found.  Procedures Procedures (including critical care time)  Medications Ordered in ED Medications  predniSONE (DELTASONE) tablet 50 mg (not administered)  ipratropium-albuterol (DUONEB) 0.5-2.5 (3) MG/3ML nebulizer solution 3 mL (3 mLs Nebulization Given 05/31/17 2323)  albuterol (PROVENTIL HFA;VENTOLIN HFA) 108 (90 Base) MCG/ACT inhaler 1-2 puff (2 puffs Inhalation Given 05/31/17 2335)     Initial Impression / Assessment and Plan / ED Course  I have reviewed the triage vital signs and the nursing notes.  Pertinent labs & imaging results that were available during my care of the patient were reviewed by me and considered in my medical decision making (see chart for details).  27 year old male presents with asthma exacerbation. Vitals are normal. He is not in respiratory distress. Duoneb was given and wheezing has greatly improved although he still has end expiratory wheezes at the bases. I offered him a second treatment however he declined. Will give a dose of steroid in the ED as well as an albuterol inhaler. He states he will try to find a PCP. Return precautions given.  Final Clinical Impressions(s) / ED Diagnoses   Final  diagnoses:  Mild intermittent asthma with exacerbation    ED Discharge Orders    None       Bethel BornGekas, Aerielle Stoklosa Marie, PA-C 05/31/17 2339    Bethann BerkshireZammit, Joseph, MD 06/01/17 1501

## 2017-06-11 ENCOUNTER — Emergency Department (HOSPITAL_COMMUNITY): Payer: BLUE CROSS/BLUE SHIELD

## 2017-06-11 ENCOUNTER — Ambulatory Visit (HOSPITAL_COMMUNITY)
Admission: AD | Admit: 2017-06-11 | Discharge: 2017-06-11 | Disposition: A | Discharge status: Home / Self Care | Payer: BLUE CROSS/BLUE SHIELD | Source: Home / Self Care | Attending: Psychiatry | Admitting: Psychiatry

## 2017-06-11 ENCOUNTER — Inpatient Hospital Stay (HOSPITAL_COMMUNITY)
Admission: AD | Admit: 2017-06-11 | Discharge: 2017-06-15 | DRG: 885 | Disposition: A | Discharge status: Rehabilitation Facility | Payer: BLUE CROSS/BLUE SHIELD | Source: Intra-hospital | Attending: Psychiatry | Admitting: Psychiatry

## 2017-06-11 ENCOUNTER — Other Ambulatory Visit: Payer: Self-pay

## 2017-06-11 ENCOUNTER — Emergency Department (HOSPITAL_COMMUNITY)
Admission: EM | Admit: 2017-06-11 | Discharge: 2017-06-11 | Disposition: A | Discharge status: Other Acute Inpatient Hospital | Payer: BLUE CROSS/BLUE SHIELD | Attending: Emergency Medicine | Admitting: Emergency Medicine

## 2017-06-11 ENCOUNTER — Encounter (HOSPITAL_COMMUNITY): Payer: Self-pay | Admitting: Nurse Practitioner

## 2017-06-11 ENCOUNTER — Encounter (HOSPITAL_COMMUNITY): Payer: Self-pay | Admitting: *Deleted

## 2017-06-11 DIAGNOSIS — F17210 Nicotine dependence, cigarettes, uncomplicated: Secondary | ICD-10-CM | POA: Insufficient documentation

## 2017-06-11 DIAGNOSIS — F1290 Cannabis use, unspecified, uncomplicated: Secondary | ICD-10-CM | POA: Diagnosis not present

## 2017-06-11 DIAGNOSIS — Z91048 Other nonmedicinal substance allergy status: Secondary | ICD-10-CM | POA: Diagnosis not present

## 2017-06-11 DIAGNOSIS — J45909 Unspecified asthma, uncomplicated: Secondary | ICD-10-CM | POA: Insufficient documentation

## 2017-06-11 DIAGNOSIS — F1120 Opioid dependence, uncomplicated: Secondary | ICD-10-CM | POA: Diagnosis not present

## 2017-06-11 DIAGNOSIS — F419 Anxiety disorder, unspecified: Secondary | ICD-10-CM

## 2017-06-11 DIAGNOSIS — F332 Major depressive disorder, recurrent severe without psychotic features: Secondary | ICD-10-CM | POA: Insufficient documentation

## 2017-06-11 DIAGNOSIS — Z811 Family history of alcohol abuse and dependence: Secondary | ICD-10-CM | POA: Diagnosis not present

## 2017-06-11 DIAGNOSIS — F1910 Other psychoactive substance abuse, uncomplicated: Secondary | ICD-10-CM | POA: Diagnosis present

## 2017-06-11 DIAGNOSIS — F909 Attention-deficit hyperactivity disorder, unspecified type: Secondary | ICD-10-CM | POA: Diagnosis present

## 2017-06-11 DIAGNOSIS — R569 Unspecified convulsions: Secondary | ICD-10-CM

## 2017-06-11 DIAGNOSIS — Z91040 Latex allergy status: Secondary | ICD-10-CM | POA: Diagnosis not present

## 2017-06-11 DIAGNOSIS — Z008 Encounter for other general examination: Secondary | ICD-10-CM | POA: Diagnosis not present

## 2017-06-11 DIAGNOSIS — M79642 Pain in left hand: Secondary | ICD-10-CM | POA: Diagnosis not present

## 2017-06-11 DIAGNOSIS — Z599 Problem related to housing and economic circumstances, unspecified: Secondary | ICD-10-CM | POA: Diagnosis not present

## 2017-06-11 DIAGNOSIS — F1123 Opioid dependence with withdrawal: Secondary | ICD-10-CM | POA: Diagnosis present

## 2017-06-11 DIAGNOSIS — Z653 Problems related to other legal circumstances: Secondary | ICD-10-CM | POA: Diagnosis not present

## 2017-06-11 DIAGNOSIS — F1490 Cocaine use, unspecified, uncomplicated: Secondary | ICD-10-CM | POA: Diagnosis not present

## 2017-06-11 DIAGNOSIS — T40605A Adverse effect of unspecified narcotics, initial encounter: Secondary | ICD-10-CM | POA: Diagnosis present

## 2017-06-11 DIAGNOSIS — R45851 Suicidal ideations: Secondary | ICD-10-CM | POA: Diagnosis present

## 2017-06-11 DIAGNOSIS — R110 Nausea: Secondary | ICD-10-CM | POA: Diagnosis not present

## 2017-06-11 DIAGNOSIS — F39 Unspecified mood [affective] disorder: Secondary | ICD-10-CM

## 2017-06-11 DIAGNOSIS — R51 Headache: Secondary | ICD-10-CM | POA: Diagnosis not present

## 2017-06-11 DIAGNOSIS — G4700 Insomnia, unspecified: Secondary | ICD-10-CM | POA: Diagnosis present

## 2017-06-11 DIAGNOSIS — Z915 Personal history of self-harm: Secondary | ICD-10-CM | POA: Diagnosis not present

## 2017-06-11 DIAGNOSIS — F1124 Opioid dependence with opioid-induced mood disorder: Secondary | ICD-10-CM | POA: Diagnosis not present

## 2017-06-11 LAB — SALICYLATE LEVEL: Salicylate Lvl: <7 mg/dL (ref 2.8–30.0)

## 2017-06-11 LAB — COMPREHENSIVE METABOLIC PANEL
ALT: 264 U/L — ABNORMAL HIGH (ref 17–63)
AST: 144 U/L — AB (ref 15–41)
Albumin: 4.1 g/dL (ref 3.5–5.0)
Alkaline Phosphatase: 83 U/L (ref 38–126)
Anion gap: 7 (ref 5–15)
BUN: 15 mg/dL (ref 6–20)
CHLORIDE: 99 mmol/L — AB (ref 101–111)
CO2: 25 mmol/L (ref 22–32)
Calcium: 9.4 mg/dL (ref 8.9–10.3)
Creatinine, Ser: 0.79 mg/dL (ref 0.61–1.24)
Glucose, Bld: 112 mg/dL — ABNORMAL HIGH (ref 65–99)
POTASSIUM: 4.1 mmol/L (ref 3.5–5.1)
SODIUM: 131 mmol/L — AB (ref 135–145)
Total Bilirubin: 1.2 mg/dL (ref 0.3–1.2)
Total Protein: 7.8 g/dL (ref 6.5–8.1)

## 2017-06-11 LAB — RAPID URINE DRUG SCREEN, HOSP PERFORMED
Amphetamines: NOT DETECTED
BARBITURATES: NOT DETECTED
BENZODIAZEPINES: POSITIVE — AB
COCAINE: NOT DETECTED
Opiates: POSITIVE — AB
Tetrahydrocannabinol: POSITIVE — AB

## 2017-06-11 LAB — CBC WITH DIFFERENTIAL/PLATELET
BASOS ABS: 0.1 10*3/uL (ref 0.0–0.1)
Basophils Relative: 1 %
EOS PCT: 6 %
Eosinophils Absolute: 0.5 10*3/uL (ref 0.0–0.7)
HEMATOCRIT: 39.7 % (ref 39.0–52.0)
HEMOGLOBIN: 13.3 g/dL (ref 13.0–17.0)
LYMPHS ABS: 2.3 10*3/uL (ref 0.7–4.0)
LYMPHS PCT: 27 %
MCH: 30 pg (ref 26.0–34.0)
MCHC: 33.5 g/dL (ref 30.0–36.0)
MCV: 89.4 fL (ref 78.0–100.0)
Monocytes Absolute: 1 10*3/uL (ref 0.1–1.0)
Monocytes Relative: 12 %
NEUTROS ABS: 4.6 10*3/uL (ref 1.7–7.7)
Neutrophils Relative %: 54 %
Platelets: 135 10*3/uL — ABNORMAL LOW (ref 150–400)
RBC: 4.44 MIL/uL (ref 4.22–5.81)
RDW: 14.2 % (ref 11.5–15.5)
WBC: 8.4 10*3/uL (ref 4.0–10.5)

## 2017-06-11 LAB — CBC
HEMATOCRIT: 39.4 % (ref 39.0–52.0)
Hemoglobin: 13.2 g/dL (ref 13.0–17.0)
MCH: 29.5 pg (ref 26.0–34.0)
MCHC: 33.5 g/dL (ref 30.0–36.0)
MCV: 88.1 fL (ref 78.0–100.0)
PLATELETS: 137 10*3/uL — AB (ref 150–400)
RBC: 4.47 MIL/uL (ref 4.22–5.81)
RDW: 13.9 % (ref 11.5–15.5)
WBC: 9 10*3/uL (ref 4.0–10.5)

## 2017-06-11 LAB — ETHANOL

## 2017-06-11 LAB — ACETAMINOPHEN LEVEL

## 2017-06-11 MED ORDER — ONDANSETRON 4 MG PO TBDP
4.0000 mg | ORAL_TABLET | Freq: Four times a day (QID) | ORAL | Status: DC | PRN
Start: 2017-06-11 — End: 2017-06-11

## 2017-06-11 MED ORDER — DICYCLOMINE HCL 20 MG PO TABS
20.0000 mg | ORAL_TABLET | Freq: Four times a day (QID) | ORAL | Status: DC | PRN
  Administered 2017-06-11 – 2017-06-15 (×7): 20 mg via ORAL
  Filled 2017-06-11 (×8): qty 1

## 2017-06-11 MED ORDER — LORAZEPAM 1 MG PO TABS
1.0000 mg | ORAL_TABLET | Freq: Once | ORAL | Status: AC
  Administered 2017-06-11: 1 mg via ORAL
  Filled 2017-06-11: qty 1

## 2017-06-11 MED ORDER — SULFAMETHOXAZOLE-TRIMETHOPRIM 800-160 MG PO TABS
1.0000 | ORAL_TABLET | Freq: Two times a day (BID) | ORAL | Status: DC
  Administered 2017-06-11 – 2017-06-15 (×8): 1 via ORAL
  Filled 2017-06-11 (×9): qty 1
  Filled 2017-06-11: qty 8
  Filled 2017-06-11: qty 1
  Filled 2017-06-11: qty 8
  Filled 2017-06-11: qty 1

## 2017-06-11 MED ORDER — IPRATROPIUM-ALBUTEROL 0.5-2.5 (3) MG/3ML IN SOLN
3.0000 mL | Freq: Once | RESPIRATORY_TRACT | Status: DC
  Filled 2017-06-11: qty 3

## 2017-06-11 MED ORDER — NAPROXEN 500 MG PO TABS
500.0000 mg | ORAL_TABLET | Freq: Two times a day (BID) | ORAL | Status: DC | PRN
  Administered 2017-06-11 – 2017-06-15 (×7): 500 mg via ORAL
  Filled 2017-06-11 (×7): qty 1

## 2017-06-11 MED ORDER — HYDROXYZINE HCL 25 MG PO TABS: 25.0000 mg | ORAL_TABLET | Freq: Four times a day (QID) | ORAL | Status: DC | PRN

## 2017-06-11 MED ORDER — ALUM & MAG HYDROXIDE-SIMETH 200-200-20 MG/5ML PO SUSP: 30.0000 mL | ORAL | Status: DC | PRN

## 2017-06-11 MED ORDER — LOPERAMIDE HCL 2 MG PO CAPS: 2.0000 mg | ORAL_CAPSULE | ORAL | Status: DC | PRN

## 2017-06-11 MED ORDER — DICYCLOMINE HCL 20 MG PO TABS: 20.0000 mg | ORAL_TABLET | Freq: Four times a day (QID) | ORAL | Status: DC | PRN

## 2017-06-11 MED ORDER — NICOTINE 21 MG/24HR TD PT24
21.0000 mg | MEDICATED_PATCH | TRANSDERMAL | Status: DC
  Filled 2017-06-11: qty 1

## 2017-06-11 MED ORDER — ALBUTEROL SULFATE HFA 108 (90 BASE) MCG/ACT IN AERS
2.0000 | INHALATION_SPRAY | RESPIRATORY_TRACT | Status: DC | PRN
  Administered 2017-06-11 (×2): 2 via RESPIRATORY_TRACT
  Filled 2017-06-11: qty 6.7

## 2017-06-11 MED ORDER — IBUPROFEN 200 MG PO TABS: 600.0000 mg | ORAL_TABLET | Freq: Three times a day (TID) | ORAL | Status: DC | PRN

## 2017-06-11 MED ORDER — ALBUTEROL SULFATE HFA 108 (90 BASE) MCG/ACT IN AERS
2.0000 | INHALATION_SPRAY | RESPIRATORY_TRACT | Status: DC | PRN
  Administered 2017-06-12 – 2017-06-15 (×5): 2 via RESPIRATORY_TRACT
  Filled 2017-06-11 (×2): qty 6.7

## 2017-06-11 MED ORDER — NICOTINE 21 MG/24HR TD PT24
21.0000 mg | MEDICATED_PATCH | Freq: Every day | TRANSDERMAL | Status: DC
  Administered 2017-06-14: 21 mg via TRANSDERMAL
  Filled 2017-06-11 (×6): qty 1

## 2017-06-11 MED ORDER — NAPROXEN 500 MG PO TABS: 500.0000 mg | ORAL_TABLET | Freq: Two times a day (BID) | ORAL | Status: DC | PRN

## 2017-06-11 MED ORDER — CLONIDINE HCL 0.1 MG PO TABS
0.1000 mg | ORAL_TABLET | Freq: Every day | ORAL | Status: DC
  Filled 2017-06-11 (×2): qty 1

## 2017-06-11 MED ORDER — CLONIDINE HCL 0.1 MG PO TABS
0.1000 mg | ORAL_TABLET | Freq: Four times a day (QID) | ORAL | Status: AC
  Administered 2017-06-11 – 2017-06-13 (×8): 0.1 mg via ORAL
  Filled 2017-06-11 (×10): qty 1

## 2017-06-11 MED ORDER — ACETAMINOPHEN 325 MG PO TABS: 650.0000 mg | ORAL_TABLET | Freq: Four times a day (QID) | ORAL | Status: DC | PRN

## 2017-06-11 MED ORDER — MAGNESIUM HYDROXIDE 400 MG/5ML PO SUSP: 30.0000 mL | Freq: Every day | ORAL | Status: DC | PRN

## 2017-06-11 MED ORDER — CLONIDINE HCL 0.1 MG PO TABS
0.1000 mg | ORAL_TABLET | ORAL | Status: AC
  Administered 2017-06-13 – 2017-06-15 (×4): 0.1 mg via ORAL
  Filled 2017-06-11 (×4): qty 1

## 2017-06-11 MED ORDER — METHOCARBAMOL 500 MG PO TABS
500.0000 mg | ORAL_TABLET | Freq: Three times a day (TID) | ORAL | Status: DC | PRN
Start: 2017-06-11 — End: 2017-06-11

## 2017-06-11 MED ORDER — FLUTICASONE PROPIONATE 50 MCG/ACT NA SUSP
2.0000 | Freq: Every day | NASAL | Status: DC
  Filled 2017-06-11: qty 16

## 2017-06-11 MED ORDER — TRAZODONE HCL 50 MG PO TABS
50.0000 mg | ORAL_TABLET | Freq: Every evening | ORAL | Status: DC | PRN
  Administered 2017-06-11 (×2): 50 mg via ORAL
  Filled 2017-06-11 (×8): qty 1

## 2017-06-11 MED ORDER — ONDANSETRON 4 MG PO TBDP
4.0000 mg | ORAL_TABLET | Freq: Four times a day (QID) | ORAL | Status: DC | PRN
  Administered 2017-06-11 – 2017-06-15 (×4): 4 mg via ORAL
  Filled 2017-06-11 (×4): qty 1

## 2017-06-11 MED ORDER — HYDROXYZINE HCL 25 MG PO TABS
25.0000 mg | ORAL_TABLET | Freq: Four times a day (QID) | ORAL | Status: DC | PRN
  Filled 2017-06-11: qty 1

## 2017-06-11 MED ORDER — SULFAMETHOXAZOLE-TRIMETHOPRIM 800-160 MG PO TABS
1.0000 | ORAL_TABLET | Freq: Two times a day (BID) | ORAL | Status: DC
  Administered 2017-06-11: 1 via ORAL
  Filled 2017-06-11: qty 1

## 2017-06-11 MED ORDER — METHOCARBAMOL 500 MG PO TABS
500.0000 mg | ORAL_TABLET | Freq: Three times a day (TID) | ORAL | Status: DC | PRN
  Administered 2017-06-11 – 2017-06-15 (×9): 500 mg via ORAL
  Filled 2017-06-11 (×9): qty 1
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
## 2017-06-11 NOTE — ED Notes (Signed)
PA here to see patient.

## 2017-06-11 NOTE — ED Notes (Signed)
Hourly rounding reveals patient in room. No complaints, stable, in no acute distress. Q15 minute rounds and monitoring via Security Cameras to continue. 
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
## 2017-06-11 NOTE — Tx Team (Signed)
Initial Treatment Plan 06/11/2017 6:29 PM Mathew Bailey [id number]    PATIENT STRESSORS: Financial difficulties Occupational concerns Substance abuse   PATIENT STRENGTHS: Average or above average intelligence Communication skills General fund of knowledge Physical Health Supportive family/friends Work skills   PATIENT IDENTIFIED PROBLEMS: "I need help with my substance abuse"  Depression  Occupational concerns  Suicide risk  Polysubstance abuse, mainly heroin and fentanyl             DISCHARGE CRITERIA:  Improved stabilization in mood, thinking, and/or behavior Motivation to continue treatment in a less acute level of care Verbal commitment to aftercare and medication compliance Withdrawal symptoms are absent or subacute and managed without 24-hour nursing intervention  PRELIMINARY DISCHARGE PLAN: Attend aftercare/continuing care group Attend 12-step recovery group Return to previous living arrangement  PATIENT/FAMILY INVOLVEMENT: This treatment plan has been presented to and reviewed with the patient, Mathew Bailey.  The patient and family have been given the opportunity to ask questions and make suggestions.  Cranford MonBeaudry, Morghan Kester Evans, RN 06/11/2017, 6:29 PM

## 2017-06-11 NOTE — BH Assessment (Signed)
[facility] Assessment Progress Note  Per Nira ConnJason Berry, NP pt is recommended for continued observation for safety and stabilization and to be reassessed by psychiatry in the AM. TTS contacted [facility] to [facility] medical [facility] and to await reassessment by psych. Spoke with triage nurse Jari Favrescar, RN and advised. TTS spoke with TTS on site in [facility] to advise pt was seen as a walk-in and will need to be reassessed by psych in the AM.   Princess BruinsAquicha Charolett Yarrow, MSW, LCSW Therapeutic Triage Specialist  [phone]

## 2017-06-11 NOTE — ED Notes (Signed)
[facility] [address] [facility].  Pending [facility] transport at 1pm.

## 2017-06-11 NOTE — Progress Notes (Signed)
Patient admitted in group that he had a bad day overall due to his withdrawal from drugs. His goal for tomorrow is to feel better than he did today.

## 2017-06-11 NOTE — ED Notes (Signed)
Hourly rounding reveals patient sleeping in room. No complaints, stable, in no acute distress. Q15 minute rounds and monitoring via Security Cameras to continue. 
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
## 2017-06-11 NOTE — ED Notes (Signed)
Pt. To xray via wheelchair.

## 2017-06-11 NOTE — [facility] Suicide Risk Assessment (Signed)
[facility]   Nursing information obtained from:   patient and chart  Demographic factors:   27 year old single male, lives with family Current Mental Status:   see below Loss Factors:   substance abuse  Historical Factors:   history of substance abuse, depression, identifies opiates as substance of choice Risk Reduction Factors:   resilience  Total Time spent with patient: 45 minutes Principal Problem:  Opiate Dependence, Opiate Induced Mood Disorder  Diagnosis:   Patient Active Problem List   Diagnosis Date Noted  . Major depressive disorder, recurrent severe without psychotic features (HCC) [F33.2] 06/11/2017  . Benzodiazepine dependence (HCC) [F13.20]   . Cannabis use disorder, severe, dependence (HCC) [F12.20] 06/23/2016  . Severe episode of recurrent major depressive disorder, without psychotic features (HCC) [F33.2]   . Opioid use disorder, severe, dependence (HCC) [F11.20] 07/28/2015  . Cellulitis of foot, right [L03.115] 02/13/2014  . R Plantar Puncture wound [T14.8XXA] 02/13/2014  . Tobacco abuse [Z72.0] 02/13/2014  . Cellulitis [L03.90] 02/13/2014  . Thrombocytopenia, unspecified (HCC) [D69.6] 01/22/2013    Continued Clinical Symptoms:  Alcohol Use Disorder Identification Test Final Score (AUDIT): 0 The "Alcohol Use Disorders Identification Test", Guidelines for Use in Primary Care, Second Edition.  World Science writerHealth Organization [facility](WHO). Score between 0-7:  no or low risk or alcohol related problems. Score between 8-15:  moderate risk of alcohol related problems. Score between 16-19:  high risk of alcohol related problems. Score 20 or above:  warrants further diagnostic evaluation for alcohol dependence and treatment.   CLINICAL FACTORS:  27 year old male , presented to hospital requesting help with substance dependence and worsening depression in the context of substance abuse . Identifies IV heroin as substance of choice and is currently  presenting with symptoms of WDL such as rhinorrhea, yawning, muscular aches, nausea .   Psychiatric Specialty Exam: Physical Exam  ROS  Blood pressure (!) 157/69, pulse 79, temperature 97.9 F (36.6 C), temperature source Oral, resp. rate 18, height 5\' 11"  (1.803 m), weight 70.3 kg (155 lb).Body mass index is 21.62 kg/m.   see admit note MSE   COGNITIVE FEATURES THAT CONTRIBUTE TO RISK:  Closed-mindedness and Loss of executive function    SUICIDE RISK:   Moderate:  Frequent suicidal ideation with limited intensity, and duration, some specificity in terms of plans, no associated intent, good self-control, limited dysphoria/symptomatology, some risk factors present, and identifiable protective factors, including available and accessible social support.  PLAN OF CARE: Patient will be admitted to inpatient psychiatric unit for stabilization and safety. Will provide and encourage milieu participation. Provide medication management and maked adjustments as needed. Will also provide medication management to minimize risk of WDL.  Will follow daily.    I certify that inpatient services furnished can reasonably be expected to improve the patient's condition.   Craige CottaFernando A Cobos, MD 06/11/2017, 3:58 PM

## 2017-06-11 NOTE — ED Notes (Signed)
Pt sleeping at present, no resp distress noted, calm & cooperative.  Monitoring for safety, Q 15 min checks in effect.

## 2017-06-11 NOTE — ED Notes (Signed)
Bed: [facility]4 Expected date:  Expected time:  Means of arrival:  Comments: 

## 2017-06-11 NOTE — Progress Notes (Signed)
D.  Pt pleasant but in obvious withdrawal on approach (see COW). Pt did attend evening AA group, interacting appropriately but minimally on unit.  Pt denies SI/HI/AVH at this time.  A.  Support and encouragement offered, medication given as ordered for withdrawal.  Spoke with NP since Pt is unable to take Vistaril and received one time order for Ativan 1 mg.  R. Pt remains safe on the unit, will continue to monitor.
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
## 2017-06-11 NOTE — H&P (Addendum)
Psychiatric Admission Assessment Adult  Patient Identification: Mathew Bailey MRN:  [id number] Date of Evaluation:  06/11/2017 Chief Complaint:  " I am depressed , tired of using drugs " Principal Diagnosis:  Opiate Dependence, Opiate Withdrawal, Opiate Induced Mood Disorder- Depressed  Diagnosis:   Patient Active Problem List   Diagnosis Date Noted  . Major depressive disorder, recurrent severe without psychotic features ([place]) [F33.2] 06/11/2017  . Benzodiazepine dependence ([place]) [F13.20]   . Cannabis use disorder, severe, dependence ([place]) [F12.20] 06/23/2016  . Severe episode of recurrent major depressive disorder, without psychotic features ([place]) [F33.2]   . Opioid use disorder, severe, dependence ([place]) [F11.20] 07/28/2015  . Cellulitis of foot, right [L03.115] 02/13/2014  . R Plantar Puncture wound [T14.8XXA] 02/13/2014  . Tobacco abuse [Z72.0] 02/13/2014  . Cellulitis [L03.90] 02/13/2014  . Thrombocytopenia, unspecified ([place]) [D69.6] 01/22/2013   History of Present Illness: Patient is a 27 year old male, presented to ED voluntarily requesting help for substance dependence. Reports opiate dependence and has been using heroin or fentanyl IV daily over recent months. States " I am really tired of using, it makes me just want to overdose and be done with it". Reports feeling depressed, which he attributes to substance abuse . States his parents, with whom he lives, are increasingly concerned with his substance abuse, and states " my dad dropped me off yesterday and told me not to come back until I complete a rehab program".  Associated Signs/Symptoms: Depression Symptoms:  depressed mood, anhedonia, insomnia, suicidal thoughts without plan, loss of energy/fatigue, decreased appetite, (Hypo) Manic Symptoms:  does not endorse  Anxiety Symptoms:  Reports increased anxiety, described as vague feeling of apprehension, recently Psychotic Symptoms:  Denies  PTSD Symptoms: Denies   Total Time spent with patient: 45 minutes  Past Psychiatric History: one prior psychiatric admission in 2017 for depression and opiate dependence . At the time was referred to [facility] at discharge. States he has never attempted suicide, remote history of self cutting, stopped several years ago, denies history of psychosis, describes history of depression which he states is mostly substance induced, as he feels better when he is sober . States he has brief mood swings,and states his mood varies significantly within the day . Does not describe any distinct manic or hypomanic episodes at this time.   Is the patient at risk to self? Yes.    Has the patient been a risk to self in the past 6 months? No.  Has the patient been a risk to self within the distant past? No.  Is the patient a risk to others? No.  Has the patient been a risk to others in the past 6 months? No.  Has the patient been a risk to others within the distant past? No.   Prior Inpatient Therapy:  as above  Prior Outpatient Therapy:  not recently   Alcohol Screening: 1. How often do you have a drink containing alcohol?: Never 2. How many drinks containing alcohol do you have on a typical day when you are drinking?: 1 or 2 3. How often do you have six or more drinks on one occasion?: Never AUDIT-C Score: 0 9. Have you or someone else been injured as a result of your drinking?: No 10. Has a relative or friend or a doctor or another health worker been concerned about your drinking or suggested you cut down?: No Alcohol Use Disorder Identification Test Final Score (AUDIT): 0 Intervention/Follow-up: AUDIT Score <7 follow-up not indicated Substance  Abuse History in the last 12 months:  Denies alcohol abuse, states he uses BZDs once a month and denies any pattern of BZD abuse recently- last used 2-3 days ago. Describes Opiate Dependence, and has been using IV .  Longest period of sobriety was 5 months in 2017.   Consequences of Substance Abuse: Relationship stress with parents .  Previous Psychotropic Medications: denies - had not been taking any medications prior to admission Psychological Evaluations: denies  Past Medical History:  Past Medical History:  Diagnosis Date  . ADHD (attention deficit hyperactivity disorder)   . Anemia   . Asthma   . Cellulitis   . GERD (gastroesophageal reflux disease)   . Thrombocytopenia ([place])   . Tobacco use    History reviewed. No pertinent surgical history. Family History: lives with parents, has two younger brothers Family Psychiatric  History: denies history of mental illness , denies suicides in family, paternal grandfather was alcoholic  Tobacco Screening:  smokes 1 PPD  Social History: 27 year old single male, no children, lives with parents, employed.  Social History   Substance and Sexual Activity  Alcohol Use No     Social History   Substance and Sexual Activity  Drug Use Yes  . Types: IV   Comment: [place], Cocaine. Daily use except cocaine is once a month. Street drugs.     Additional Social History:  Allergies:   Allergies  Allergen Reactions  . Nickel   . Other Other (See Comments)    Stated that Narcotics causes patient severe aggravation and mood changes. ADVISED NOT TO TAKE OR BE PRESCRIBED THIS MEDICATION  . Latex Rash   Lab Results:  Results for orders placed or performed during the hospital encounter of 06/11/17 (from the past 48 hour(s))  Comprehensive metabolic panel     Status: Abnormal   Collection Time: 06/11/17  1:51 AM  Result Value Ref Range   Sodium 131 (L) 135 - 145 mmol/L   Potassium 4.1 3.5 - 5.1 mmol/L   Chloride 99 (L) 101 - 111 mmol/L   CO2 25 22 - 32 mmol/L   Glucose, Bld 112 (H) 65 - 99 mg/dL   BUN 15 6 - 20 mg/dL   Creatinine, Ser 0.79 0.61 - 1.24 mg/dL   Calcium 9.4 8.9 - 10.3 mg/dL   Total Protein 7.8 6.5 - 8.1 g/dL   Albumin 4.1 3.5 - 5.0 g/dL   AST 144 (H) 15 - 41 U/L   ALT 264 (H) 17 - 63  U/L   Alkaline Phosphatase 83 38 - 126 U/L   Total Bilirubin 1.2 0.3 - 1.2 mg/dL   GFR calc non Af Amer >60 >60 mL/min   GFR calc Af Amer >60 >60 mL/min    Comment: (NOTE) The eGFR has been calculated using the CKD EPI equation. This calculation has not been validated in all clinical situations. eGFR's persistently <60 mL/min signify possible Chronic Kidney Disease.    Anion gap 7 5 - 15  Ethanol     Status: None   Collection Time: 06/11/17  1:51 AM  Result Value Ref Range   Alcohol, Ethyl (B) <10 <10 mg/dL    Comment:        LOWEST DETECTABLE LIMIT FOR SERUM ALCOHOL IS 10 mg/dL FOR MEDICAL PURPOSES ONLY   Salicylate level     Status: None   Collection Time: 06/11/17  1:51 AM  Result Value Ref Range   Salicylate Lvl <1.5 2.8 - 30.0 mg/dL  Acetaminophen level  Status: Abnormal   Collection Time: 06/11/17  1:51 AM  Result Value Ref Range   Acetaminophen (Tylenol), Serum <10 (L) 10 - 30 ug/mL    Comment:        THERAPEUTIC CONCENTRATIONS VARY SIGNIFICANTLY. A RANGE OF 10-30 ug/mL MAY BE AN EFFECTIVE CONCENTRATION FOR MANY PATIENTS. HOWEVER, SOME ARE BEST TREATED AT CONCENTRATIONS OUTSIDE THIS RANGE. ACETAMINOPHEN CONCENTRATIONS >150 ug/mL AT 4 HOURS AFTER INGESTION AND >50 ug/mL AT 12 HOURS AFTER INGESTION ARE OFTEN ASSOCIATED WITH TOXIC REACTIONS.   cbc     Status: Abnormal   Collection Time: 06/11/17  1:51 AM  Result Value Ref Range   WBC 9.0 4.0 - 10.5 K/uL   RBC 4.47 4.22 - 5.81 MIL/uL   Hemoglobin 13.2 13.0 - 17.0 g/dL   HCT 39.4 39.0 - 52.0 %   MCV 88.1 78.0 - 100.0 fL   MCH 29.5 26.0 - 34.0 pg   MCHC 33.5 30.0 - 36.0 g/dL   RDW 13.9 11.5 - 15.5 %   Platelets 137 (L) 150 - 400 K/uL  Rapid urine drug screen (hospital performed)     Status: Abnormal   Collection Time: 06/11/17  1:51 AM  Result Value Ref Range   Opiates POSITIVE (A) NONE DETECTED   Cocaine NONE DETECTED NONE DETECTED   Benzodiazepines POSITIVE (A) NONE DETECTED   Amphetamines NONE  DETECTED NONE DETECTED   Tetrahydrocannabinol POSITIVE (A) NONE DETECTED   Barbiturates NONE DETECTED NONE DETECTED    Comment: (NOTE) DRUG SCREEN FOR MEDICAL PURPOSES ONLY.  IF CONFIRMATION IS NEEDED FOR ANY PURPOSE, NOTIFY LAB WITHIN 5 DAYS. LOWEST DETECTABLE LIMITS FOR URINE DRUG SCREEN Drug Class                     Cutoff (ng/mL) Amphetamine and metabolites    1000 Barbiturate and metabolites    200 Benzodiazepine                 220 Tricyclics and metabolites     300 Opiates and metabolites        300 Cocaine and metabolites        300 THC                            50   CBC with Differential/Platelet     Status: Abnormal   Collection Time: 06/11/17  1:51 AM  Result Value Ref Range   WBC 8.4 4.0 - 10.5 K/uL   RBC 4.44 4.22 - 5.81 MIL/uL   Hemoglobin 13.3 13.0 - 17.0 g/dL   HCT 39.7 39.0 - 52.0 %   MCV 89.4 78.0 - 100.0 fL   MCH 30.0 26.0 - 34.0 pg   MCHC 33.5 30.0 - 36.0 g/dL   RDW 14.2 11.5 - 15.5 %   Platelets 135 (L) 150 - 400 K/uL   Neutrophils Relative % 54 %   Neutro Abs 4.6 1.7 - 7.7 K/uL   Lymphocytes Relative 27 %   Lymphs Abs 2.3 0.7 - 4.0 K/uL   Monocytes Relative 12 %   Monocytes Absolute 1.0 0.1 - 1.0 K/uL   Eosinophils Relative 6 %   Eosinophils Absolute 0.5 0.0 - 0.7 K/uL   Basophils Relative 1 %   Basophils Absolute 0.1 0.0 - 0.1 K/uL    Blood Alcohol level:  Lab Results  Component Value Date   ETH <10 06/11/2017   ETH <5 25/42/7062    Metabolic Disorder Labs:  No results found for: HGBA1C, MPG No results found for: PROLACTIN No results found for: CHOL, TRIG, HDL, CHOLHDL, VLDL, LDLCALC  Current Medications: Current Facility-Administered Medications  Medication Dose Route Frequency Provider Last Rate Last Dose  . acetaminophen (TYLENOL) tablet 650 mg  650 mg Oral Q6H PRN Patrecia Pour, NP      . albuterol (PROVENTIL HFA;VENTOLIN HFA) 108 (90 Base) MCG/ACT inhaler 2 puff  2 puff Inhalation Q4H PRN Patrecia Pour, NP      . alum & mag  hydroxide-simeth (MAALOX/MYLANTA) 200-200-20 MG/5ML suspension 30 mL  30 mL Oral Q4H PRN Patrecia Pour, NP      . cloNIDine (CATAPRES) tablet 0.1 mg  0.1 mg Oral QID Money, Darnelle Maffucci B, FNP   0.1 mg at 06/11/17 1446   Followed by  . [START ON 06/13/2017] cloNIDine (CATAPRES) tablet 0.1 mg  0.1 mg Oral BH-qamhs Money, Lowry Ram, FNP       Followed by  . [START ON 06/16/2017] cloNIDine (CATAPRES) tablet 0.1 mg  0.1 mg Oral QAC breakfast Money, Lowry Ram, FNP      . dicyclomine (BENTYL) tablet 20 mg  20 mg Oral Q6H PRN Patrecia Pour, NP   20 mg at 06/11/17 1447  . hydrOXYzine (ATARAX/VISTARIL) tablet 25 mg  25 mg Oral Q6H PRN Patrecia Pour, NP      . loperamide (IMODIUM) capsule 2-4 mg  2-4 mg Oral PRN Patrecia Pour, NP      . magnesium hydroxide (MILK OF MAGNESIA) suspension 30 mL  30 mL Oral Daily PRN Patrecia Pour, NP      . methocarbamol (ROBAXIN) tablet 500 mg  500 mg Oral Q8H PRN Patrecia Pour, NP   500 mg at 06/11/17 1447  . naproxen (NAPROSYN) tablet 500 mg  500 mg Oral BID PRN Patrecia Pour, NP      . ondansetron (ZOFRAN-ODT) disintegrating tablet 4 mg  4 mg Oral Q6H PRN Patrecia Pour, NP      . sulfamethoxazole-trimethoprim (BACTRIM DS,SEPTRA DS) 800-160 MG per tablet 1 tablet  1 tablet Oral Q12H Patrecia Pour, NP       PTA Medications: Medications Prior to Admission  Medication Sig Dispense Refill Last Dose  . albuterol (PROVENTIL) (2.5 MG/3ML) 0.083% nebulizer solution Take 3 mLs (2.5 mg total) by nebulization every 6 (six) hours as needed for wheezing or shortness of breath. 75 mL 1 unk  . predniSONE (DELTASONE) 50 MG tablet Take 1 tablet (50 mg total) by mouth daily. (Patient not taking: Reported on 06/11/2017) 4 tablet 0 Completed Course at Unknown time    Musculoskeletal: Strength & Muscle Tone: within normal limits Gait & Station: normal Patient leans: N/A  Psychiatric Specialty Exam: Physical Exam  Review of Systems  Constitutional: Positive for chills.  HENT:        Rhinorrhea, excessive yawning   Respiratory: Negative.   Cardiovascular: Negative.   Gastrointestinal: Positive for nausea. Negative for diarrhea and vomiting.  Musculoskeletal: Positive for myalgias.       Describes muscle aches, spasms related to WDL  Skin: Negative.   Neurological: Positive for seizures and headaches.       Reports history of isolated seizure " a long time ago" but denies history of seizure disorder   Endo/Heme/Allergies: Negative.   Psychiatric/Behavioral: Positive for depression and substance abuse.  All other systems reviewed and are negative.   Blood pressure (!) 157/69, pulse 79, temperature 97.9 F (36.6 C), temperature source  Oral, resp. rate 18, height _0  (1.803 m), weight 70.3 kg (155 lb).Body mass index is 21.62 kg/m.  General Appearance: Fairly Groomed  Eye Contact:  Fair  Speech:  Normal Rate  Volume:  Decreased  Mood:  reports depression, sadness  Affect:  constricted, anxious   Thought Process:  Linear and Descriptions of Associations: Intact  Orientation:  Other:  fully alert and attentive   Thought Content:  denies hallucinations, no delusions, not internally preoccupied   Suicidal Thoughts:  No denies any suicidal or self injurious ideations, contracts for safety   Homicidal Thoughts:  No  Memory:  recent and remote grossly intact   Judgement:  Fair  Insight:  Fair  Psychomotor Activity:  no psychomotor agitation  Concentration:  Concentration: Fair and Attention Span: Fair  Recall:  Good  Fund of Knowledge:  Good  Language:  Good  Akathisia:  Negative  Handed:  Right  AIMS (if indicated):     Assets:  Communication Skills Desire for Improvement Social Support  ADL's:  Intact  Cognition:  WNL  Sleep:       Treatment Plan Summary: Daily contact with patient to assess and evaluate symptoms and progress in treatment, Medication management, Plan inpatient treatment and medications as below  Observation Level/Precautions:  15  minute checks  Laboratory:  as needed - repeat CBC,    BMP, patient also requests to be tested for Hepatitis, HIV based on IVDA  Psychotherapy:  [place], group therapy   Medications:  Clonidine detox protocol to minimize symptoms of opiate WDL. We discussed starting an antidepressant medication but patient prefers to wait and determine if mood normalizes in the context of sobriety  Consultations: as needed    Discharge Concerns:  Patient states he wants to go to a rehab at discharge   Estimated LOS: 5-6 days   Other:     Physician Treatment Plan for Primary Diagnosis: Opiate Dependence  Long Term Goal(s): Improvement in symptoms so as ready for discharge  Short Term Goals: Ability to identify triggers associated with substance abuse/mental health issues will improve  Physician Treatment Plan for Secondary Diagnosis: Depression, consider Substance Induced Mood Disorder versus MDD   Long Term Goal(s): Improvement in symptoms so as ready for discharge  Short Term Goals: Ability to verbalize feelings will improve, Ability to disclose and discuss suicidal ideas, Ability to demonstrate self-control will improve, Ability to identify and develop effective coping behaviors will improve and Ability to maintain clinical measurements within normal limits will improve  I certify that inpatient services furnished can reasonably be expected to improve the patient's condition.    Jenne Campus, MD 1/12/20193:11 PM

## 2017-06-11 NOTE — ED Notes (Signed)
Pt. Transferred to [facility] from ED to room 35 after screening for contraband.  Pt. Oriented to unit including Q15 minute rounds as well as the security cameras for their protection. Patient is alert and oriented, warm and dry in no acute distress. Patient denies SI, HI, and AVH. Pt. Encouraged to let me know if needs arise. Pt. requesting an Albuterol inhaler like has at home for his "Asthma".

## 2017-06-11 NOTE — Progress Notes (Signed)
Admission note:  Patient is a 27 yo male admitted [facility] due to substance [facility].  Patient presented as a walk in and sent to ED for medical clearance. Patient has cellulitis in his right hand due to IV use which he is taking antibiotics.  He also has some track marks on both arms. His medical hx includes GERD, asthma, cellulitis and thrombocytopenia.  He reports he has been using heroin and fentanyl daily with no significant periods of sobriety.  He has a prior admission here and was [facility] treatment.  He was able to stay sober a few months.  He reports he had a job at [employer], however, lost it due to his substance abuse.  Patient reports that he is starting to feel his withdrawal symptoms.  He complains of body aches, chills and overall malaise.  He was started on the clonidine protocol and is tolerating it well.  His UDS was positive for opioids, THS and benzos.  Patient reports use of benzo infrequently.  He lives with his parents in [place] and they are supportive.  He has intermittent thoughts of self harm with no specific plan.  Patient also has a prior admit at [facility] with same symptoms.  Patient presents with sad and sullen affect; his mood is depressed.  He is cooperative with staff. He was oriented to unit and staff.

## 2017-06-11 NOTE — [facility] Counselor (Signed)
Adult Comprehensive Assessment  Patient ID: Mathew Bailey, male   DOB: 01/22/1991, 27 y.o.   MRN: [id number]  Information Source: Information source: Patient  Current Stressors:  Educational / Learning stressors: Denies stressors Employment / Job issues: Denies stressors to CSW, but told assessor in ED that he has just lost his job due to his substance abuse Family Relationships: "Not the best" - they just kicked him out of the home.  He does not believe he can return there even after rehab. Financial / Lack of resources (include bankruptcy): Denies stressors Housing / Lack of housing: Just got kicked out of parents' home.  Wants to go to rehab, but does not know where he will live afterwrad. Physical health (include injuries & life threatening diseases): Normally fine, stressed now with detox, withdrawal symptoms. Social relationships: Denies stressors Substance abuse: Cannot stop using drugs - really wants to. Bereavement / Loss: Denies stressors  Living/Environment/Situation:  Living Arrangements: Parent, Other relatives(Mother, father, 2 younger brothers) Living conditions (as described by patient or guardian): Good How long has patient lived in current situation?: Whole life  -  CANNOT RETURN THERE What is atmosphere in current home: Comfortable, Supportive, Loving  Family History:  Marital status: Single Are you sexually active?: No What is your sexual orientation?: Straight Does patient have children?: No  Childhood History:  By whom was/is the patient raised?: Both parents Description of patient's relationship with caregiver when they were a child: "Alright" with both parents Patient's description of current relationship with people who raised him/her: Mother - does not talk to her really; Father - on the rock right now because of pt's drug use How were you disciplined when you got in trouble as a child/adolescent?: Grounded Does patient have siblings?: Yes Number of  Siblings: 2 Description of patient's current relationship with siblings: Brothers - does not talk to them even though they're in the same home. Did patient suffer any verbal/emotional/physical/sexual abuse as a child?: No Did patient suffer from severe childhood neglect?: No Has patient ever been sexually abused/assaulted/raped as an adolescent or adult?: No Was the patient ever a victim of a crime or a disaster?: No Witnessed domestic violence?: No Has patient been effected by domestic violence as an adult?: No  Education:  Highest grade of school patient has completed: [employer], [employer]film[employer] Currently a student?: No Learning disability?: No  Employment/Work Situation:   Employment situation: Employed Where is patient currently employed?: [employer] - [employer] farm managerpackage handling How long has patient been employed?: 2 years Patient's job has been impacted by current illness: Yes Describe how patient's job has been impacted: About to get fired or else has been fired What is the longest time patient has a held a job?: 2 years 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 time?: current job Has patient ever been in the Eli Lilly and Companymilitary?: No Are There Guns or Other Weapons in Your Home?: No  Financial Resources:   Financial resources: Income from employment, Private insurance(BCBS) Does patient have a representative payee or guardian?: No  Alcohol/Substance Abuse:   What has been your use of drugs/alcohol within the last 12 months?: Heroin, Fentanyl - IV daily; Marijuana occasional Alcohol/Substance Abuse Treatment Hx: Past Tx, Inpatient, Attends AA/NA If yes, describe treatment: [facility] in 2017; [facility] days in 2017; NA in the past Has alcohol/substance abuse ever caused legal problems?: No(States to CSW no legal issues, but told assessor that he has pending charges.)  Social Support System:   Patient's Community Support System: Fair  Describe Community Support System: Father, mother Type of faith/religion:  [place] Knuckles How does patient's faith help to cope with current illness?: Doesn't right now  Leisure/Recreation:   Leisure and Hobbies: Nothing  Strengths/Needs:   What things does the patient do well?: Playing hockey In what areas does patient struggle / problems for patient: Sobriety, depression  Discharge Plan:   Does patient have access to transportation?: Yes Will patient be returning to same living situation after discharge?: No Plan for living situation after discharge: Needs to go to rehab, may still not be allowed back home afterwards Currently receiving community mental health services: No If no, would patient like referral for services when discharged?: Yes (What county?)([place], [place]) Does patient have financial barriers related to discharge medications?: Yes Patient description of barriers related to discharge medications: May have lost or may lose job at [employer], as well as Community education officer.  Summary/Recommendations:   Summary and Recommendations (to be completed by the evaluator): Patient is a 27yo male admitted with increased depression related to daily IV heroin and Fentanyl abuse, requesting help.  Primary stressors include possibly losing job, recent altercation, being kicked out of his parents' home, legal charges, and struggling with substances for the past 6-7 years.   He has a history of suicide attempt in 2017 and ideation in 2018 that resulted in IVC.   Patient will benefit from crisis stabilization, medication evaluation, group therapy and psychoeducation, in addition to case management for discharge planning. At discharge it is recommended that Patient adhere to the established discharge plan and continue in treatment.  Lynnell Chad. 06/11/2017

## 2017-06-12 DIAGNOSIS — F332 Major depressive disorder, recurrent severe without psychotic features: Principal | ICD-10-CM

## 2017-06-12 DIAGNOSIS — G4700 Insomnia, unspecified: Secondary | ICD-10-CM

## 2017-06-12 DIAGNOSIS — F17210 Nicotine dependence, cigarettes, uncomplicated: Secondary | ICD-10-CM

## 2017-06-12 DIAGNOSIS — F1490 Cocaine use, unspecified, uncomplicated: Secondary | ICD-10-CM

## 2017-06-12 DIAGNOSIS — F1290 Cannabis use, unspecified, uncomplicated: Secondary | ICD-10-CM

## 2017-06-12 LAB — HIV ANTIBODY (ROUTINE TESTING W REFLEX): HIV Screen 4th Generation wRfx: NONREACTIVE

## 2017-06-12 MED ORDER — TRAZODONE HCL 100 MG PO TABS
100.0000 mg | ORAL_TABLET | Freq: Every evening | ORAL | Status: DC | PRN
  Administered 2017-06-12 – 2017-06-14 (×3): 100 mg via ORAL
  Filled 2017-06-12 (×3): qty 1
  Filled 2017-06-12: qty 42
  Filled 2017-06-12: qty 1

## 2017-06-12 MED ORDER — LORAZEPAM 1 MG PO TABS
1.0000 mg | ORAL_TABLET | Freq: Once | ORAL | Status: AC | PRN
  Administered 2017-06-12: 1 mg via ORAL
  Filled 2017-06-12: qty 1

## 2017-06-12 NOTE — Progress Notes (Signed)
D.  Pt appears to be in moderately severe withdrawal upon approach.  Pt denies other complaints at this time.  Pt did attend evening AA group and was observed engaged in minimal but appropriate interaction with peers on the unit.  Pt denies SI/HI/AVH at this time.  A.  Support and encouragement offered, medication given as ordered.  R.  Pt remains safe on the unit, will continue to monitor.

## 2017-06-12 NOTE — Progress Notes (Signed)
Patient ID: Mathew Bailey, male   DOB: 07/24/1990, 27 y.o.   MRN: [id number]  DAR: Pt. Denies SI/HI and A/V Hallucinations. He reports that he did not sleep well last night, his appetite is poor, his energy is low, and concentration is poor. He reports, "I feel like shit." Patient reports withdrawal symptoms including anxiety, sweating, tremors, generalized body ache, and runny nose. Support and encouragement provided to the patient. Scheduled and PRN medications administered to patient per physician's orders.  Patient is seen in the dayroom throughout most of the day. Q15 minute checks are maintained for safety.

## 2017-06-12 NOTE — Progress Notes (Signed)
Pt attended AA group this evening.  

## 2017-06-12 NOTE — Progress Notes (Signed)
[facility] MD Progress Note  06/12/2017 1:28 PM Mathew Bailey  MRN:  [id number]   Subjective:  Patient reports that "I feel like crap." He reports sweating, nausea, disrupted sleep, and just feeling exhausted. He reports that the Clonidine has helped in the past, but it doesn't seem to be doing as much this time. He denies any SI/HI/AVH and contracts for safety.   Objective: Patient's chart and findings reviewed and discussed with treatment team. Patient presented in the day room putting 2 chairs together to stretch out on to watch TV. He is cooperative, but appears irritable and continued trying to end interview early and just wanted to go lay back down. He will continue current medication regimen and reassess for antidepressants after detox.  Principal Problem: Major depressive disorder, recurrent severe without psychotic features ([place]) Diagnosis:   Patient Active Problem List   Diagnosis Date Noted  . Major depressive disorder, recurrent severe without psychotic features ([place]) [F33.2] 06/11/2017  . Benzodiazepine dependence ([place]) [F13.20]   . Cannabis use disorder, severe, dependence ([place]) [F12.20] 06/23/2016  . Severe episode of recurrent major depressive disorder, without psychotic features ([place]) [F33.2]   . Opioid use disorder, severe, dependence ([place]) [F11.20] 07/28/2015  . Cellulitis of foot, right [L03.115] 02/13/2014  . R Plantar Puncture wound [T14.8XXA] 02/13/2014  . Tobacco abuse [Z72.0] 02/13/2014  . Cellulitis [L03.90] 02/13/2014  . Thrombocytopenia, unspecified ([place]) [D69.6] 01/22/2013   Total Time spent with patient: 15 minutes  Past Psychiatric History: See H&P  Past Medical History:  Past Medical History:  Diagnosis Date  . ADHD (attention deficit hyperactivity disorder)   . Anemia   . Asthma   . Cellulitis   . GERD (gastroesophageal reflux disease)   . Thrombocytopenia ([place])   . Tobacco use    History reviewed. No pertinent surgical history. Family History:  History reviewed. No pertinent family history. Family Psychiatric  History: See H&P Social History:  Social History   Substance and Sexual Activity  Alcohol Use No     Social History   Substance and Sexual Activity  Drug Use Yes  . Types: IV   Comment: [place], Cocaine. Daily use except cocaine is once a month. Street drugs.     Social History   Socioeconomic History  . Marital status: Single    Spouse name: None  . Number of children: None  . Years of education: None  . Highest education level: None  Social Needs  . Financial resource strain: None  . Food insecurity - worry: None  . Food insecurity - inability: None  . Transportation needs - medical: None  . Transportation needs - non-medical: None  Occupational History  . None  Tobacco Use  . Smoking status: Current Every Day Smoker    Packs/day: 0.50    Years: 10.00    Pack years: 5.00    Types: Cigarettes  . Smokeless tobacco: Never Used  Substance and Sexual Activity  . Alcohol use: No  . Drug use: Yes    Types: IV    Comment: [place], Cocaine. Daily use except cocaine is once a month. Street drugs.   . Sexual activity: None  Other Topics Concern  . None  Social History Narrative  . None   Additional Social History:                         Sleep: Fair  Appetite:  Fair  Current Medications: Current Facility-Administered Medications  Medication Dose Route Frequency Provider  Last Rate Last Dose  . acetaminophen (TYLENOL) tablet 650 mg  650 mg Oral Q6H PRN Patrecia Pour, NP      . albuterol (PROVENTIL HFA;VENTOLIN HFA) 108 (90 Base) MCG/ACT inhaler 2 puff  2 puff Inhalation Q4H PRN Patrecia Pour, NP   2 puff at 06/12/17 1214  . alum & mag hydroxide-simeth (MAALOX/MYLANTA) 200-200-20 MG/5ML suspension 30 mL  30 mL Oral Q4H PRN Patrecia Pour, NP      . cloNIDine (CATAPRES) tablet 0.1 mg  0.1 mg Oral QID Gladyse Corvin, Darnelle Maffucci B, FNP   0.1 mg at 06/12/17 1213   Followed by  . [START ON  06/13/2017] cloNIDine (CATAPRES) tablet 0.1 mg  0.1 mg Oral BH-qamhs Kastin Cerda, Lowry Ram, FNP       Followed by  . [START ON 06/16/2017] cloNIDine (CATAPRES) tablet 0.1 mg  0.1 mg Oral QAC breakfast Laneshia Pina, Lowry Ram, FNP      . dicyclomine (BENTYL) tablet 20 mg  20 mg Oral Q6H PRN Patrecia Pour, NP   20 mg at 06/12/17 1215  . hydrOXYzine (ATARAX/VISTARIL) tablet 25 mg  25 mg Oral Q6H PRN Patrecia Pour, NP      . loperamide (IMODIUM) capsule 2-4 mg  2-4 mg Oral PRN Patrecia Pour, NP      . magnesium hydroxide (MILK OF MAGNESIA) suspension 30 mL  30 mL Oral Daily PRN Patrecia Pour, NP      . methocarbamol (ROBAXIN) tablet 500 mg  500 mg Oral Q8H PRN Patrecia Pour, NP   500 mg at 06/12/17 1215  . naproxen (NAPROSYN) tablet 500 mg  500 mg Oral BID PRN Patrecia Pour, NP   500 mg at 06/12/17 0836  . nicotine (NICODERM CQ - dosed in mg/24 hours) patch 21 mg  21 mg Transdermal Daily Cobos, [name], MD      . ondansetron (ZOFRAN-ODT) disintegrating tablet 4 mg  4 mg Oral Q6H PRN Patrecia Pour, NP   4 mg at 06/12/17 0252  . sulfamethoxazole-trimethoprim (BACTRIM DS,SEPTRA DS) 800-160 MG per tablet 1 tablet  1 tablet Oral Q12H Patrecia Pour, NP   1 tablet at 06/12/17 0837  . traZODone (DESYREL) tablet 50 mg  50 mg Oral QHS,MR X 1 Lindon Romp A, NP   50 mg at 06/11/17 2352    Lab Results:  Results for orders placed or performed during the hospital encounter of 06/11/17 (from the past 48 hour(s))  Comprehensive metabolic panel     Status: Abnormal   Collection Time: 06/11/17  1:51 AM  Result Value Ref Range   Sodium 131 (L) 135 - 145 mmol/L   Potassium 4.1 3.5 - 5.1 mmol/L   Chloride 99 (L) 101 - 111 mmol/L   CO2 25 22 - 32 mmol/L   Glucose, Bld 112 (H) 65 - 99 mg/dL   BUN 15 6 - 20 mg/dL   Creatinine, Ser 0.79 0.61 - 1.24 mg/dL   Calcium 9.4 8.9 - 10.3 mg/dL   Total Protein 7.8 6.5 - 8.1 g/dL   Albumin 4.1 3.5 - 5.0 g/dL   AST 144 (H) 15 - 41 U/L   ALT 264 (H) 17 - 63 U/L   Alkaline  Phosphatase 83 38 - 126 U/L   Total Bilirubin 1.2 0.3 - 1.2 mg/dL   GFR calc non Af Amer >60 >60 mL/min   GFR calc Af Amer >60 >60 mL/min    Comment: (NOTE) The eGFR has been calculated using  the CKD EPI equation. This calculation has not been validated in all clinical situations. eGFR's persistently <60 mL/min signify possible Chronic Kidney Disease.    Anion gap 7 5 - 15  Ethanol     Status: None   Collection Time: 06/11/17  1:51 AM  Result Value Ref Range   Alcohol, Ethyl (B) <10 <10 mg/dL    Comment:        LOWEST DETECTABLE LIMIT FOR SERUM ALCOHOL IS 10 mg/dL FOR MEDICAL PURPOSES ONLY   Salicylate level     Status: None   Collection Time: 06/11/17  1:51 AM  Result Value Ref Range   Salicylate Lvl <4.0 2.8 - 30.0 mg/dL  Acetaminophen level     Status: Abnormal   Collection Time: 06/11/17  1:51 AM  Result Value Ref Range   Acetaminophen (Tylenol), Serum <10 (L) 10 - 30 ug/mL    Comment:        THERAPEUTIC CONCENTRATIONS VARY SIGNIFICANTLY. A RANGE OF 10-30 ug/mL MAY BE AN EFFECTIVE CONCENTRATION FOR MANY PATIENTS. HOWEVER, SOME ARE BEST TREATED AT CONCENTRATIONS OUTSIDE THIS RANGE. ACETAMINOPHEN CONCENTRATIONS >150 ug/mL AT 4 HOURS AFTER INGESTION AND >50 ug/mL AT 12 HOURS AFTER INGESTION ARE OFTEN ASSOCIATED WITH TOXIC REACTIONS.   cbc     Status: Abnormal   Collection Time: 06/11/17  1:51 AM  Result Value Ref Range   WBC 9.0 4.0 - 10.5 K/uL   RBC 4.47 4.22 - 5.81 MIL/uL   Hemoglobin 13.2 13.0 - 17.0 g/dL   HCT 39.4 39.0 - 52.0 %   MCV 88.1 78.0 - 100.0 fL   MCH 29.5 26.0 - 34.0 pg   MCHC 33.5 30.0 - 36.0 g/dL   RDW 13.9 11.5 - 15.5 %   Platelets 137 (L) 150 - 400 K/uL  Rapid urine drug screen (hospital performed)     Status: Abnormal   Collection Time: 06/11/17  1:51 AM  Result Value Ref Range   Opiates POSITIVE (A) NONE DETECTED   Cocaine NONE DETECTED NONE DETECTED   Benzodiazepines POSITIVE (A) NONE DETECTED   Amphetamines NONE DETECTED NONE  DETECTED   Tetrahydrocannabinol POSITIVE (A) NONE DETECTED   Barbiturates NONE DETECTED NONE DETECTED    Comment: (NOTE) DRUG SCREEN FOR MEDICAL PURPOSES ONLY.  IF CONFIRMATION IS NEEDED FOR ANY PURPOSE, NOTIFY LAB WITHIN 5 DAYS. LOWEST DETECTABLE LIMITS FOR URINE DRUG SCREEN Drug Class                     Cutoff (ng/mL) Amphetamine and metabolites    1000 Barbiturate and metabolites    200 Benzodiazepine                 981 Tricyclics and metabolites     300 Opiates and metabolites        300 Cocaine and metabolites        300 THC                            50   CBC with Differential/Platelet     Status: Abnormal   Collection Time: 06/11/17  1:51 AM  Result Value Ref Range   WBC 8.4 4.0 - 10.5 K/uL   RBC 4.44 4.22 - 5.81 MIL/uL   Hemoglobin 13.3 13.0 - 17.0 g/dL   HCT 39.7 39.0 - 52.0 %   MCV 89.4 78.0 - 100.0 fL   MCH 30.0 26.0 - 34.0 pg   MCHC 33.5 30.0 - 36.0  g/dL   RDW 14.2 11.5 - 15.5 %   Platelets 135 (L) 150 - 400 K/uL   Neutrophils Relative % 54 %   Neutro Abs 4.6 1.7 - 7.7 K/uL   Lymphocytes Relative 27 %   Lymphs Abs 2.3 0.7 - 4.0 K/uL   Monocytes Relative 12 %   Monocytes Absolute 1.0 0.1 - 1.0 K/uL   Eosinophils Relative 6 %   Eosinophils Absolute 0.5 0.0 - 0.7 K/uL   Basophils Relative 1 %   Basophils Absolute 0.1 0.0 - 0.1 K/uL    Blood Alcohol level:  Lab Results  Component Value Date   ETH <10 06/11/2017   ETH <5 16/03/9603    Metabolic Disorder Labs: No results found for: HGBA1C, MPG No results found for: PROLACTIN No results found for: CHOL, TRIG, HDL, CHOLHDL, VLDL, LDLCALC  Physical Findings: AIMS: Facial and Oral Movements Muscles of Facial Expression: None, normal Lips and Perioral Area: None, normal Jaw: None, normal Tongue: None, normal,Extremity Movements Upper (arms, wrists, hands, fingers): None, normal Lower (legs, knees, ankles, toes): None, normal, Trunk Movements Neck, shoulders, hips: None, normal, Overall  Severity Severity of abnormal movements (highest score from questions above): None, normal Incapacitation due to abnormal movements: None, normal Patient's awareness of abnormal movements (rate only patient's report): No Awareness, Dental Status Current problems with teeth and/or dentures?: No Does patient usually wear dentures?: No  CIWA:    COWS:  COWS Total Score: 12  Musculoskeletal: Strength & Muscle Tone: within normal limits Gait & Station: normal Patient leans: N/A  Psychiatric Specialty Exam: Physical Exam  Nursing note and vitals reviewed. Constitutional: He is oriented to person, place, and time. He appears well-developed and well-nourished.  Cardiovascular: Normal rate.  Respiratory: Effort normal.  Musculoskeletal: Normal range of motion.  Neurological: He is alert and oriented to person, place, and time.  Skin: Skin is warm.    Review of Systems  Constitutional: Negative.   HENT: Negative.   Eyes: Negative.   Respiratory: Negative.   Cardiovascular: Negative.   Gastrointestinal: Negative.   Genitourinary: Negative.   Musculoskeletal: Negative.   Skin: Negative.   Neurological: Negative.   Endo/Heme/Allergies: Negative.   Psychiatric/Behavioral: Positive for substance abuse.    Blood pressure 123/62, pulse 74, temperature 97.8 F (36.6 C), temperature source Oral, resp. rate 16, height 5' 11"  (1.803 m), weight 70.3 kg (155 lb).Body mass index is 21.62 kg/m.  General Appearance: Casual  Eye Contact:  Good  Speech:  Clear and Coherent and Normal Rate  Volume:  Normal  Mood:  Irritable  Affect:  Flat  Thought Process:  Goal Directed and Descriptions of Associations: Intact  Orientation:  Full (Time, Place, and Person)  Thought Content:  WDL  Suicidal Thoughts:  No  Homicidal Thoughts:  No  Memory:  Immediate;   Good Recent;   Good Remote;   Good  Judgement:  Good  Insight:  Good  Psychomotor Activity:  Normal  Concentration:  Concentration: Fair and  Attention Span: Fair  Recall:  Good  Fund of Knowledge:  Good  Language:  Good  Akathisia:  No  Handed:  Right  AIMS (if indicated):     Assets:  Communication Skills Desire for Improvement Financial Resources/Insurance Physical Health Social Support  ADL's:  Intact  Cognition:  WNL  Sleep:  Number of Hours: 0.75   Problems Addressed: MDD severe Opioid dependence  Treatment Plan Summary: Daily contact with patient to assess and evaluate symptoms and progress in treatment, Medication management  and Plan is to:  -Continue Clonidine Detox Protocol -Continue Trazodone 50 mg PO QHS PRN for insomnia -Encourage group therapy participation  Lewis Shock, FNP 06/12/2017, 1:28 PM

## 2017-06-12 NOTE — [facility] Group Notes (Signed)
[facility] Group Notes: (Clinical Social Work)   06/12/2017      Type of Therapy:  Group Therapy   Participation Level:  Did Not Attend despite MHT prompting   Sierria Bruney Grossman-Orr, LCSW 06/12/2017, 1:09 PM     

## 2017-06-13 LAB — HEPATITIS B SURFACE ANTIGEN: HEP B S AG: NEGATIVE

## 2017-06-13 LAB — HEPATITIS C ANTIBODY

## 2017-06-13 NOTE — Progress Notes (Signed)
Recreation Therapy Notes  Date: 06/13/17 Time: 0930 Location: 300 [address] Dayroom  Group Topic: Stress Management  Goal Area(s) Addresses:  Patient will verbalize importance of using healthy stress management.  Patient will identify positive emotions associated with healthy stress management.   Intervention: Stress Management  Activity :  [facility].  LRT introduced the stress management technique of guided imagery.  LRT read a script to allow patients to visualize being in a protected wildlife sanctuary.  Patients were to follow along as script was read to engage in activity.  Education:  Stress Management, Discharge Planning.   Education Outcome: Acknowledges edcuation/In group clarification offered/Needs additional education  Clinical Observations/Feedback: Pt did not attend group.     Caroll RancherMarjette Najir Roop, LRT/CTRS         Caroll RancherLindsay, Eve Rey A 06/13/2017 12:48 PM

## 2017-06-13 NOTE — Progress Notes (Signed)
D  Pt is pleasant on approach and cooperative   He interacts well with staff and peers  He reported feeling dehydrated and asked for gator aid  He said the water tastes like sewage     A   Verbal support given   Medications administered and effectiveness monitored    Q 15 min checks R   Pt is safe at present time

## 2017-06-13 NOTE — [facility] Group Notes (Signed)
LCSW Group Therapy Note   06/13/2017 1:15pm   Type of Therapy and Topic:  Group Therapy:  Overcoming Obstacles   Participation Level:  None-pt sleeping in chair during group. No participation and did not wake up when prompted.    Description of Group:    In this group patients will be encouraged to explore what they see as obstacles to their own wellness and recovery. They will be guided to discuss their thoughts, feelings, and behaviors related to these obstacles. The group will process together ways to cope with barriers, with attention given to specific choices patients can make. Each patient will be challenged to identify changes they are motivated to make in order to overcome their obstacles. This group will be process-oriented, with patients participating in exploration of their own experiences as well as giving and receiving support and challenge from other group members.   Therapeutic Goals: 1. Patient will identify personal and current obstacles as they relate to admission. 2. Patient will identify barriers that currently interfere with their wellness or overcoming obstacles.  3. Patient will identify feelings, thought process and behaviors related to these barriers. 4. Patient will identify two changes they are willing to make to overcome these obstacles:      Summary of Patient Progress x     Therapeutic Modalities:   Cognitive Behavioral Therapy Solution Focused Therapy Motivational Interviewing Relapse Prevention Therapy  Ledell PeoplesHeather N Smart, LCSW 06/13/2017 1:06 PM

## 2017-06-13 NOTE — Tx Team (Signed)
Interdisciplinary Treatment and Diagnostic Plan Update  06/13/2017 Time of Session: 0830AM Mathew Bailey MRN: [id number]  Principal Diagnosis: Major depressive disorder, recurrent severe without psychotic features ([facility])  Secondary Diagnoses: Principal Problem:   Major depressive disorder, recurrent severe without psychotic features ([facility]) Active Problems:   Opioid use disorder, severe, dependence ([facility])   Current Medications:  Current Facility-Administered Medications  Medication Dose Route Frequency Provider Last Rate Last Dose  . acetaminophen (TYLENOL) tablet 650 mg  650 mg Oral Q6H PRN Charm Rings, NP      . albuterol (PROVENTIL HFA;VENTOLIN HFA) 108 (90 Base) MCG/ACT inhaler 2 puff  2 puff Inhalation Q4H PRN Charm Rings, NP   2 puff at 06/12/17 1214  . alum & mag hydroxide-simeth (MAALOX/MYLANTA) 200-200-20 MG/5ML suspension 30 mL  30 mL Oral Q4H PRN Charm Rings, NP      . cloNIDine (CATAPRES) tablet 0.1 mg  0.1 mg Oral QID Money, [name], FNP   0.1 mg at 06/13/17 4098   Followed by  . cloNIDine (CATAPRES) tablet 0.1 mg  0.1 mg Oral BH-qamhs Money, [name], FNP       Followed by  . [START ON 06/16/2017] cloNIDine (CATAPRES) tablet 0.1 mg  0.1 mg Oral QAC breakfast Money, [name], FNP      . dicyclomine (BENTYL) tablet 20 mg  20 mg Oral Q6H PRN Charm Rings, NP   20 mg at 06/12/17 2120  . loperamide (IMODIUM) capsule 2-4 mg  2-4 mg Oral PRN Charm Rings, NP      . magnesium hydroxide (MILK OF MAGNESIA) suspension 30 mL  30 mL Oral Daily PRN Charm Rings, NP      . methocarbamol (ROBAXIN) tablet 500 mg  500 mg Oral Q8H PRN Charm Rings, NP   500 mg at 06/12/17 2120  . naproxen (NAPROSYN) tablet 500 mg  500 mg Oral BID PRN Charm Rings, NP   500 mg at 06/12/17 2120  . nicotine (NICODERM CQ - dosed in mg/24 hours) patch 21 mg  21 mg Transdermal Daily Cobos, [name], MD      . ondansetron (ZOFRAN-ODT) disintegrating tablet 4 mg  4 mg Oral Q6H PRN Charm Rings, NP   4 mg at 06/12/17 2120  . sulfamethoxazole-trimethoprim (BACTRIM DS,SEPTRA DS) 800-160 MG per tablet 1 tablet  1 tablet Oral Q12H Charm Rings, NP   1 tablet at 06/13/17 0855  . traZODone (DESYREL) tablet 100 mg  100 mg Oral QHS PRN,MR X 1 [name] A, NP   100 mg at 06/12/17 2120   PTA Medications: Medications Prior to Admission  Medication Sig Dispense Refill Last Dose  . albuterol (PROVENTIL) (2.5 MG/3ML) 0.083% nebulizer solution Take 3 mLs (2.5 mg total) by nebulization every 6 (six) hours as needed for wheezing or shortness of breath. 75 mL 1 unk  . predniSONE (DELTASONE) 50 MG tablet Take 1 tablet (50 mg total) by mouth daily. (Patient not taking: Reported on 06/11/2017) 4 tablet 0 Completed Course at Unknown time    Patient Stressors: Financial difficulties Occupational concerns Substance abuse  Patient Strengths: Average or above average intelligence Communication skills General fund of knowledge Physical Health Supportive family/friends Work skills  Treatment Modalities: Medication Management, Group therapy, Case management,  1 to 1 session with clinician, Psychoeducation, Recreational therapy.   Physician Treatment Plan for Primary Diagnosis: Major depressive disorder, recurrent severe without psychotic features ([facility]) Long Term Goal(s): Improvement in symptoms so as  ready for discharge Improvement in symptoms so as ready for discharge   Short Term Goals: Ability to identify triggers associated with substance abuse/mental health issues will improve Ability to verbalize feelings will improve Ability to disclose and discuss suicidal ideas Ability to demonstrate self-control will improve Ability to identify and develop effective coping behaviors will improve Ability to maintain clinical measurements within normal limits will improve  Medication Management: Evaluate patient's response, side effects, and tolerance of medication regimen.  Therapeutic  Interventions: 1 to 1 sessions, Unit Group sessions and Medication administration.  Evaluation of Outcomes: Progressing  Physician Treatment Plan for Secondary Diagnosis: Principal Problem:   Major depressive disorder, recurrent severe without psychotic features (H[facility]) Active Problems:   Opioid use disorder, severe, dependence (HCC)  Long Term Goal(s): Improvement in symptoms so as ready for discharge Improvement in symptoms so as ready for discharge   Short Term Goals: Ability to identify triggers associated with substance abuse/mental health issues will improve Ability to verbalize feelings will improve Ability to disclose and discuss suicidal ideas Ability to demonstrate self-control will improve Ability to identify and develop effective coping behaviors will improve Ability to maintain clinical measurements within normal limits will improve     Medication Management: Evaluate patient's response, side effects, and tolerance of medication regimen.  Therapeutic Interventions: 1 to 1 sessions, Unit Group sessions and Medication administration.  Evaluation of Outcomes: Progressing   RN Treatment Plan for Primary Diagnosis: Major depressive disorder, recurrent severe without psychotic features (HCC) Long Term Goal(s): Knowledge of disease and therapeutic regimen to maintain health will improve  Short Term Goals: Ability to remain free from injury will improve, Ability to disclose and discuss suicidal ideas and Ability to identify and develop effective coping behaviors will improve  Medication Management: RN will administer medications as ordered by provider, will assess and evaluate patient's response and provide education to patient for prescribed medication. RN will report any adverse and/or side effects to prescribing provider.  Therapeutic Interventions: 1 on 1 counseling sessions, Psychoeducation, Medication administration, Evaluate responses to treatment, Monitor vital signs and  CBGs as ordered, Perform/monitor CIWA, COWS, AIMS and Fall Risk screenings as ordered, Perform wound care treatments as ordered.  Evaluation of Outcomes: Progressing   LCSW Treatment Plan for Primary Diagnosis: Major depressive disorder, recurrent severe without psychotic features (HCC) Long Term Goal(s): Safe transition to appropriate next level of care at discharge, Engage patient in therapeutic group addressing interpersonal concerns.  Short Term Goals: Engage patient in aftercare planning with referrals and resources, Facilitate patient progression through stages of change regarding substance use diagnoses and concerns and Identify triggers associated with mental health/substance abuse issues  Therapeutic Interventions: Assess for all discharge needs, 1 to 1 time with Social worker, Explore available resources and support systems, Assess for adequacy in community support network, Educate family and significant other(s) on suicide prevention, Complete Psychosocial Assessment, Interpersonal group therapy.  Evaluation of Outcomes: Progressing   Progress in Treatment: Attending groups: Yes. Participating in groups: Yes. Taking medication as prescribed: Yes. Toleration medication: Yes. Family/Significant other contact made: No, will contact:  pt's father. Patient understands diagnosis: Yes. Discussing patient identified problems/goals with staff: Yes. Medical problems stabilized or resolved: Yes. Denies suicidal/homicidal ideation: Yes. Issues/concerns per patient self-inventory: No. Other: n/a   New problem(s) identified: No, Describe:  n/a  New Short Term/Long Term Goal(s): detox, medication management for mood stabilization; elimination of SI thoughts, development of comprehensive mental wellness/sobriety plan.   Discharge Plan or Barriers: CSW assessing for appropriate referrals.  Pt has BCBS but DOES NOT want to use his insurance to pursue residential treatment, which [facility]; [facility]. CSW continuing to assess.   Reason for Continuation of Hospitalization: Anxiety Depression Medication stabilization Suicidal ideation Withdrawal symptoms  Estimated Length of Stay: Wed, 06/15/17  Attendees: Patient: 06/13/2017 8:57 AM  Physician: Dr. Altamese Carolinaainville MD; Dr. Jama Flavorsobos MD 06/13/2017 8:57 AM  Nursing: Foy Guadalajarahrista RN; Erskine SquibbJane RN 06/13/2017 8:57 AM  RN Care Manager:x 06/13/2017 8:57 AM  Social Worker: Chartered loss adjusterHeather Smart, LCSW 06/13/2017 8:57 AM  Recreational Therapist: x 06/13/2017 8:57 AM  Other: Armandina StammerAgnes Nwoko NP; Feliz Beamravis Money NP 06/13/2017 8:57 AM  Other:  06/13/2017 8:57 AM  Other: 06/13/2017 8:57 AM    Scribe for Treatment Team: Ledell PeoplesHeather N Smart, LCSW 06/13/2017 8:57 AM

## 2017-06-13 NOTE — Progress Notes (Addendum)
[facility] [place] MD Progress Note  06/13/2017 11:16 AM Mathew Bailey  MRN:  [id number]   Subjective:  Patient reports improved but still present symptoms of opiate WDL- reports ongoing aches, pains, and vague nausea, although no vomiting . States , however, " I do feel better than I did a couple of days ago". Reports vivid drug using dreams and significant cravings for opiates during the daytime- states he is motivated in recovery and is wanting to go to a rehab setting at discharge. States his parents are very supportive and helping him find a place to go to .  Denies medication side effects. Denies suicidal ideations. Reports partially improved insomnia but describes sleep as fair and fragmented   Objective:  I have discussed case with treatment team and have met with patient. Patient presents with gradually improving opiate WDL symptoms, but reports some lingering nausea, aches, and insomnia.Continues to present with mild rhinorrhea , yawning .  Denies vomiting, and is tolerating PO intake well . He stresses having significant cravings , and states he feels he would be at high risk of relapsing in the community at this time. States that he is interested in going to a rehab at discharge and that his parents are helping him to find a program to go to. Denies SI. Denies medication side effects. More visible on unit as opiate WDL symptoms subside. No disruptive or agitated behaviors . Labs - HIV non reactive . Hep B S Ag negative. Hep C Ab (+) >11     Principal Problem: Major depressive disorder, recurrent severe without psychotic features ([place]) Diagnosis:   Patient Active Problem List   Diagnosis Date Noted  . Major depressive disorder, recurrent severe without psychotic features ([place]) [F33.2] 06/11/2017  . Benzodiazepine dependence ([place]) [F13.20]   . Cannabis use disorder, severe, dependence ([place]) [F12.20] 06/23/2016  . Severe episode of recurrent major depressive disorder, without psychotic  features ([place]) [F33.2]   . Opioid use disorder, severe, dependence ([place]) [F11.20] 07/28/2015  . Cellulitis of foot, right [L03.115] 02/13/2014  . R Plantar Puncture wound [T14.8XXA] 02/13/2014  . Tobacco abuse [Z72.0] 02/13/2014  . Cellulitis [L03.90] 02/13/2014  . Thrombocytopenia, unspecified ([place]) [D69.6] 01/22/2013   Total Time spent with patient: 20 minutes  Past Psychiatric History: See H&P  Past Medical History:  Past Medical History:  Diagnosis Date  . ADHD (attention deficit hyperactivity disorder)   . Anemia   . Asthma   . Cellulitis   . GERD (gastroesophageal reflux disease)   . Thrombocytopenia ([place])   . Tobacco use    History reviewed. No pertinent surgical history. Family History: History reviewed. No pertinent family history. Family Psychiatric  History: See H&P Social History:  Social History   Substance and Sexual Activity  Alcohol Use No     Social History   Substance and Sexual Activity  Drug Use Yes  . Types: IV   Comment: Mari[place]ana, Cocaine. Daily use except cocaine is once a month. Street drugs.     Social History   Socioeconomic History  . Marital status: Single    Spouse name: None  . Number of children: None  . Years of education: None  . Highest education level: None  Social Needs  . Financial resource strain: None  . Food insecurity - worry: None  . Food insecurity - inability: None  . Transportation needs - medical: None  . Transportation needs - non-medical: None  Occupational History  . None  Tobacco Use  . Smoking status: Current  Every Day Smoker    Packs/day: 0.50    Years: 10.00    Pack years: 5.00    Types: Cigarettes  . Smokeless tobacco: Never Used  Substance and Sexual Activity  . Alcohol use: No  . Drug use: Yes    Types: IV    Comment: Mari[place]ana, Cocaine. Daily use except cocaine is once a month. Street drugs.   . Sexual activity: None  Other Topics Concern  . None  Social History Narrative  . None    Additional Social History:   Sleep: Fair  Appetite:  Fair- improving   Current Medications: Current Facility-Administered Medications  Medication Dose Route Frequency Provider Last Rate Last Dose  . acetaminophen (TYLENOL) tablet 650 mg  650 mg Oral Q6H PRN Patrecia Pour, NP      . albuterol (PROVENTIL HFA;VENTOLIN HFA) 108 (90 Base) MCG/ACT inhaler 2 puff  2 puff Inhalation Q4H PRN Patrecia Pour, NP   2 puff at 06/13/17 0857  . alum & mag hydroxide-simeth (MAALOX/MYLANTA) 200-200-20 MG/5ML suspension 30 mL  30 mL Oral Q4H PRN Patrecia Pour, NP      . cloNIDine (CATAPRES) tablet 0.1 mg  0.1 mg Oral QID Money, Darnelle Maffucci B, FNP   0.1 mg at 06/13/17 5638   Followed by  . cloNIDine (CATAPRES) tablet 0.1 mg  0.1 mg Oral BH-qamhs Money, Lowry Ram, FNP       Followed by  . [START ON 06/16/2017] cloNIDine (CATAPRES) tablet 0.1 mg  0.1 mg Oral QAC breakfast Money, Darnelle Maffucci B, FNP      . dicyclomine (BENTYL) tablet 20 mg  20 mg Oral Q6H PRN Patrecia Pour, NP   20 mg at 06/12/17 2120  . loperamide (IMODIUM) capsule 2-4 mg  2-4 mg Oral PRN Patrecia Pour, NP      . magnesium hydroxide (MILK OF MAGNESIA) suspension 30 mL  30 mL Oral Daily PRN Patrecia Pour, NP      . methocarbamol (ROBAXIN) tablet 500 mg  500 mg Oral Q8H PRN Patrecia Pour, NP   500 mg at 06/13/17 9373  . naproxen (NAPROSYN) tablet 500 mg  500 mg Oral BID PRN Patrecia Pour, NP   500 mg at 06/13/17 0902  . nicotine (NICODERM CQ - dosed in mg/24 hours) patch 21 mg  21 mg Transdermal Daily Humberto Addo A, MD      . ondansetron (ZOFRAN-ODT) disintegrating tablet 4 mg  4 mg Oral Q6H PRN Patrecia Pour, NP   4 mg at 06/12/17 2120  . sulfamethoxazole-trimethoprim (BACTRIM DS,SEPTRA DS) 800-160 MG per tablet 1 tablet  1 tablet Oral Q12H Patrecia Pour, NP   1 tablet at 06/13/17 0855  . traZODone (DESYREL) tablet 100 mg  100 mg Oral QHS PRN,MR X 1 Lindon Romp A, NP   100 mg at 06/12/17 2120    Lab Results:  Results for orders  placed or performed during the hospital encounter of 06/11/17 (from the past 48 hour(s))  HIV antibody     Status: None   Collection Time: 06/12/17  6:43 AM  Result Value Ref Range   HIV Screen 4th Generation wRfx Non Reactive Non Reactive    Comment: (NOTE) Performed At: [facility] [place], [place] [id number] Rush Farmer MD [id number] Performed at [facility], [place] [address], [place], [place] [address]     Blood Alcohol level:  Lab Results  Component Value Date   ETH <10 06/11/2017  ETH <5 44/81/8563    Metabolic Disorder Labs: No results found for: HGBA1C, MPG No results found for: PROLACTIN No results found for: CHOL, TRIG, HDL, CHOLHDL, VLDL, LDLCALC  Physical Findings: AIMS: Facial and Oral Movements Muscles of Facial Expression: None, normal Lips and Perioral Area: None, normal Jaw: None, normal Tongue: None, normal,Extremity Movements Upper (arms, wrists, hands, fingers): None, normal Lower (legs, knees, ankles, toes): None, normal, Trunk Movements Neck, shoulders, hips: None, normal, Overall Severity Severity of abnormal movements (highest score from questions above): None, normal Incapacitation due to abnormal movements: None, normal Patient's awareness of abnormal movements (rate only patient's report): No Awareness, Dental Status Current problems with teeth and/or dentures?: No Does patient usually wear dentures?: No  CIWA:    COWS:  COWS Total Score: 8  Musculoskeletal: Strength & Muscle Tone: within normal limits Gait & Station: normal Patient leans: N/A  Psychiatric Specialty Exam: Physical Exam  Nursing note and vitals reviewed. Constitutional: He is oriented to person, place, and time. He appears well-developed and well-nourished.  Cardiovascular: Normal rate.  Respiratory: Effort normal.  Musculoskeletal: Normal range of motion.  Neurological: He is alert and oriented to person, place, and  time.  Skin: Skin is warm.    Review of Systems  Constitutional: Negative.   HENT: Negative.   Eyes: Negative.   Respiratory: Negative.   Cardiovascular: Negative.   Gastrointestinal: Negative.   Genitourinary: Negative.   Musculoskeletal: Negative.   Skin: Negative.   Neurological: Negative.   Endo/Heme/Allergies: Negative.   Psychiatric/Behavioral: Positive for substance abuse.  reports lingering nausea, no vomiting, and some ongoing diffuse myalgias.   Blood pressure 120/73, pulse 67, temperature 98.6 F (37 C), temperature source Oral, resp. rate 16, height _0  (1.803 m), weight 70.3 kg (155 lb).Body mass index is 21.62 kg/m.  General Appearance: Fairly Groomed  Eye Contact:  Good  Speech:  Normal Rate  Volume:  Normal  Mood:  improving, less dysphoric   Affect:  improving, less blunted   Thought Process:  Linear and Descriptions of Associations: Intact  Orientation:  Other:  fully alert and attentive  Thought Content:  WDL and no hallucinations, no delusions, not internally preoccupied   Suicidal Thoughts:  No- denies any suicidal or self injurious ideations, denies homicidal ideations, contracts for safety on unit   Homicidal Thoughts:  No  Memory:  Immediate;   Good Recent;   Good Remote;   Good  Judgement:  Other:  improving   Insight:  Present  Psychomotor Activity:  Normal  Concentration:  Concentration: Good and Attention Span: Good  Recall:  Good  Fund of Knowledge:  Good  Language:  Good  Akathisia:  No  Handed:  Right  AIMS (if indicated):     Assets:  Communication Skills Desire for Improvement Financial Resources/Insurance Physical Health Social Support  ADL's:  Intact  Cognition:  WNL  Sleep:  Number of Hours: 3   Assessment - patient is presenting with improving but still present symptoms of opiate WDL. He reports significant cravings for opiates at this time, and is motivated in going to residential rehab setting at discharge. Mood improving  , denies SI. Future oriented . I reviewed labs as above with patient, reviewed Hep C (+ ) finding and the importance of following up with PCP / GI specialist as an outpatient for ongoing monitoring and treatment options. He expresses understanding .    Treatment Plan Summary: Daily contact with patient to assess and evaluate symptoms and progress in treatment,  Medication management and Plan is to:  Encourage ongoing efforts to work on sobriety and relapse prevention Completing Clonidine detox protocol to minimize symptoms of WDL  Continue Trazodone 100 mg PO QHS PRN for insomnia At this time patient reports improving mood in the context of sobriety- will not start antidepressant medication at this time Recheck LFTs to follow up on elevated transaminases  Encourage group therapy participation to work on coping skills and symptom reduction Treatment team working on disposition planning - patient wanting to go to rehab at discharge    Jenne Campus, MD 06/13/2017, 11:16 AM   Patient ID: Mathew Bailey, male   DOB: 09/25/1990, 27 y.o.   MRN: [id number]

## 2017-06-13 NOTE — [facility] Suicide Risk Assessment (Signed)
Mathew Bailey INPATIENT:  Family/Significant Other Suicide Prevention Education  Suicide Prevention Education:  Contact Attempts: Mathew Bailey (pt's father) [phone] home and [phone] (cell) has been identified by the patient as the family member/significant other with whom the patient will be residing, and identified as the person(s) who will aid the patient in the event of a mental health crisis.  With written consent from the patient, two attempts were made to provide suicide prevention education, prior to and/or following the patient's discharge.  We were unsuccessful in providing suicide prevention education.  A suicide education pamphlet was given to the patient to share with family/significant other.  Date and time of first attempt:06/13/17 at 11:32AM (Voicemial left on both lines requesting call back at earliest convenience).   Mathew Bailey 06/13/2017, 11:33 AM   CSW spoke with pt's father this morning. SPE and aftercare reviewed. Pt does not have access to guns/firearms. Pt's father and mother are working on getting him into a christian based program called [facility]. He asked that CSW send out referrals to other facilities as well. CSW met with pt to discuss above. Pt agreeable to [facility] and [facility] referrals.   Mathew Bailey, MSW, LCSW Clinical Social Worker 06/14/2017 10:36 AM

## 2017-06-13 NOTE — Progress Notes (Signed)
Patient ID: Mathew CivilDaniel H Bailey, male   DOB: 11/03/1990, 27 y.o.   MRN: [id number]  DAR: Pt. Denies SI/HI and A/V Hallucinations. He reports withdrawal symptoms including nausea, soreness, and runny nose. Patient does report mild diffuse discomfort. Support and encouragement provided to the patient. Scheduled medications administered to patient per physician's orders. Patient is seen in the milieu more frequently today. He reports that his withdrawal symptoms are improving. He is minimal with staff but cooperative. No behavioral incidents observed. Q15 minute checks are maintained for safety.

## 2017-06-14 DIAGNOSIS — F1123 Opioid dependence with withdrawal: Secondary | ICD-10-CM

## 2017-06-14 LAB — HEPATIC FUNCTION PANEL
ALT: 145 U/L — ABNORMAL HIGH (ref 17–63)
AST: 45 U/L — AB (ref 15–41)
Albumin: 4 g/dL (ref 3.5–5.0)
Alkaline Phosphatase: 80 U/L (ref 38–126)
Bilirubin, Direct: 0.2 mg/dL (ref 0.1–0.5)
Indirect Bilirubin: 0.5 mg/dL (ref 0.3–0.9)
TOTAL PROTEIN: 7.8 g/dL (ref 6.5–8.1)
Total Bilirubin: 0.7 mg/dL (ref 0.3–1.2)

## 2017-06-14 NOTE — Progress Notes (Signed)
[facility] MD Progress Note  06/14/2017 11:14 AM Mathew Bailey  MRN:  [id number]   Subjective:  Mathew Bailey reports " I am feeling okay today, it's hard to detox."  Objective: Mathew Bailey is awake, alert and oriented. Seen resting in bed. Reports night sweats and generalized body aches and discomfort.  States overall he is  feeling better.  Denies suicidal or homicidal ideation. Denies auditory or visual hallucination and does not appear to be responding to internal stimuli.  - patient appears to be goal directed toward finding and attending a residential and or long term treatment program. Mathew Bailey denies depression or depressive symptoms.  Reports his appetite has improved. Reports resting well throughout the night. Support, encouragement and reassurance was provided.     Principal Problem: Major depressive disorder, recurrent severe without psychotic features ([facility]) Diagnosis:   Patient Active Problem List   Diagnosis Date Noted  . Major depressive disorder, recurrent severe without psychotic features ([facility]) [F33.2] 06/11/2017  . Benzodiazepine dependence ([facility]) [F13.20]   . Cannabis use disorder, severe, dependence ([facility]) [F12.20] 06/23/2016  . Severe episode of recurrent major depressive disorder, without psychotic features ([facility]) [F33.2]   . Opioid use disorder, severe, dependence ([facility]) [F11.20] 07/28/2015  . Cellulitis of foot, right [L03.115] 02/13/2014  . R Plantar Puncture wound [T14.8XXA] 02/13/2014  . Tobacco abuse [Z72.0] 02/13/2014  . Cellulitis [L03.90] 02/13/2014  . Thrombocytopenia, unspecified ([facility]) [D69.6] 01/22/2013   Total Time spent with patient: 20 minutes  Past Psychiatric History: See H&P  Past Medical History:  Past Medical History:  Diagnosis Date  . ADHD (attention deficit hyperactivity disorder)   . Anemia   . Asthma   . Cellulitis   . GERD (gastroesophageal reflux disease)   . Thrombocytopenia ([facility])   . Tobacco use    History reviewed. No pertinent  surgical history. Family History: History reviewed. No pertinent family history. Family Psychiatric  History: See H&P Social History:  Social History   Substance and Sexual Activity  Alcohol Use No     Social History   Substance and Sexual Activity  Drug Use Yes  . Types: IV   Comment: Mariiuana, Cocaine. Daily use except cocaine is once a month. Street drugs.     Social History   Socioeconomic History  . Marital status: Single    Spouse name: None  . Number of children: None  . Years of education: None  . Highest education level: None  Social Needs  . Financial resource strain: None  . Food insecurity - worry: None  . Food insecurity - inability: None  . Transportation needs - medical: None  . Transportation needs - non-medical: None  Occupational History  . None  Tobacco Use  . Smoking status: Current Every Day Smoker    Packs/day: 0.50    Years: 10.00    Pack years: 5.00    Types: Cigarettes  . Smokeless tobacco: Never Used  Substance and Sexual Activity  . Alcohol use: No  . Drug use: Yes    Types: IV    Comment: Mari[place]ana, Cocaine. Daily use except cocaine is once a month. Street drugs.   . Sexual activity: None  Other Topics Concern  . None  Social History Narrative  . None   Additional Social History:   Sleep: Fair  Appetite:  Fair- improving   Current Medications: Current Facility-Administered Medications  Medication Dose Route Frequency Provider Last Rate Last Dose  . acetaminophen (TYLENOL) tablet 650 mg  650 mg Oral Q6H PRN Lord,  Herminio Heads, NP      . albuterol (PROVENTIL HFA;VENTOLIN HFA) 108 (90 Base) MCG/ACT inhaler 2 puff  2 puff Inhalation Q4H PRN Charm Rings, NP   2 puff at 06/14/17 1040  . alum & mag hydroxide-simeth (MAALOX/MYLANTA) 200-200-20 MG/5ML suspension 30 mL  30 mL Oral Q4H PRN Charm Rings, NP      . cloNIDine (CATAPRES) tablet 0.1 mg  0.1 mg Oral BH-qamhs Money, [name], FNP   0.1 mg at 06/14/17 1041   Followed by  .  [START ON 06/16/2017] cloNIDine (CATAPRES) tablet 0.1 mg  0.1 mg Oral QAC breakfast Money, [name], FNP      . dicyclomine (BENTYL) tablet 20 mg  20 mg Oral Q6H PRN Charm Rings, NP   20 mg at 06/12/17 2120  . loperamide (IMODIUM) capsule 2-4 mg  2-4 mg Oral PRN Charm Rings, NP      . magnesium hydroxide (MILK OF MAGNESIA) suspension 30 mL  30 mL Oral Daily PRN Charm Rings, NP      . methocarbamol (ROBAXIN) tablet 500 mg  500 mg Oral Q8H PRN Charm Rings, NP   500 mg at 06/14/17 1041  . naproxen (NAPROSYN) tablet 500 mg  500 mg Oral BID PRN Charm Rings, NP   500 mg at 06/13/17 2148  . nicotine (NICODERM CQ - dosed in mg/24 hours) patch 21 mg  21 mg Transdermal Daily Cobos, [name], MD      . ondansetron (ZOFRAN-ODT) disintegrating tablet 4 mg  4 mg Oral Q6H PRN Charm Rings, NP   4 mg at 06/12/17 2120  . sulfamethoxazole-trimethoprim (BACTRIM DS,SEPTRA DS) 800-160 MG per tablet 1 tablet  1 tablet Oral Q12H Charm Rings, NP   1 tablet at 06/13/17 2148  . traZODone (DESYREL) tablet 100 mg  100 mg Oral QHS PRN,MR X 1 [name] A, NP   100 mg at 06/13/17 2148    Lab Results:  Results for orders placed or performed during the hospital encounter of 06/11/17 (from the past 48 hour(s))  Hepatic function panel     Status: Abnormal   Collection Time: 06/14/17  7:38 AM  Result Value Ref Range   Total Protein 7.8 6.5 - 8.1 g/dL   Albumin 4.0 3.5 - 5.0 g/dL   AST 45 (H) 15 - 41 U/L   ALT 145 (H) 17 - 63 U/L   Alkaline Phosphatase 80 38 - 126 U/L   Total Bilirubin 0.7 0.3 - 1.2 mg/dL   Bilirubin, Direct 0.2 0.1 - 0.5 mg/dL   Indirect Bilirubin 0.5 0.3 - 0.9 mg/dL    Comment: Performed at [facility], [address], [place], [place] [address]    Blood Alcohol level:  Lab Results  Component Value Date   ETH <10 06/11/2017   ETH <5 06/22/2016    Metabolic Disorder Labs: No results found for: HGBA1C, MPG No results found for: PROLACTIN No results  found for: CHOL, TRIG, HDL, CHOLHDL, VLDL, LDLCALC  Physical Findings: AIMS: Facial and Oral Movements Muscles of Facial Expression: None, normal Lips and Perioral Area: None, normal Jaw: None, normal Tongue: None, normal,Extremity Movements Upper (arms, wrists, hands, fingers): None, normal Lower (legs, knees, ankles, toes): None, normal, Trunk Movements Neck, shoulders, hips: None, normal, Overall Severity Severity of abnormal movements (highest score from questions above): None, normal Incapacitation due to abnormal movements: None, normal Patient's awareness of abnormal movements (rate only patient's report): No Awareness,  Dental Status Current problems with teeth and/or dentures?: No Does patient usually wear dentures?: No  CIWA:    COWS:  COWS Total Score: 8  Musculoskeletal: Strength & Muscle Tone: within normal limits Gait & Station: normal Patient leans: N/A  Psychiatric Specialty Exam: Physical Exam  Nursing note and vitals reviewed. Constitutional: He is oriented to person, place, and time. He appears well-developed and well-nourished.  Cardiovascular: Normal rate.  Respiratory: Effort normal.  Musculoskeletal: Normal range of motion.  Neurological: He is alert and oriented to person, place, and time.  Skin: Skin is warm.    Review of Systems  Constitutional: Positive for diaphoresis and malaise/fatigue.  Gastrointestinal: Positive for nausea.  Musculoskeletal: Positive for myalgias.  Psychiatric/Behavioral: Positive for substance abuse.  All other systems reviewed and are negative. reports lingering nausea, no vomiting, and some ongoing diffuse myalgias.   Blood pressure (!) 102/48, pulse 87, temperature 98.3 F (36.8 C), temperature source Oral, resp. rate 16, height 5\' 11"  (1.803 m), weight 70.3 kg (155 lb).Body mass index is 21.62 kg/m.  General Appearance: Fairly Groomed, pleasant and cooperative, sweating profusely   Eye Contact:  Good  Speech:  Normal  Rate  Volume:  Normal  Mood:  improving, less dysphoric   Affect:  improving, less blunted   Thought Process:  Linear and Descriptions of Associations: Intact  Orientation:  Other:  fully alert and attentive  Thought Content:  WDL and Hallucinations: None  Suicidal Thoughts:  No-   Homicidal Thoughts:  No  Memory:  Immediate;   Good Recent;   Good Remote;   Good  Judgement:  Other:  improving   Insight:  Present  Psychomotor Activity:  Normal  Concentration:  Concentration: Good and Attention Span: Good  Recall:  Good  Fund of Knowledge:  Good  Language:  Good  Akathisia:  No  Handed:  Right  AIMS (if indicated):     Assets:  Communication Skills Desire for Improvement Financial Resources/Insurance Physical Health Social Support  ADL's:  Intact  Cognition:  WNL  Sleep:  Number of Hours: 6.25    Treatment Plan Summary: Daily contact with patient to assess and evaluate symptoms and progress in treatment and Medication management   Continue with current treatment plan on 06/14/2016 except where noted   Encourage ongoing efforts to work on sobriety and relapse prevention  Opiate detox  - Continue Clonidine detox protocol to minimize  symptoms of WDL  Insomnia: Continue Trazodone 100 mg PO QHS PRN for insomnia   Labs reviewed  Recheck LFTs to follow up on elevated transaminases 1/15  AST 45 and ALT 145 downward trend  Encourage group therapy participation to work on Pharmacologistcoping skills and symptom reduction Treatment team working on disposition planning - patient wanting to go to rehab at discharge    Mathew Rackanika N Chianna Spirito, NP 06/14/2017, 11:14 AM

## 2017-06-14 NOTE — [facility] Group Notes (Signed)
[facility] 06/14/2017 1:15pm  Type of Therapy: Mental Health Association Presentation  Participation Level: Active  Participation Quality: Attentive  Affect: Appropriate  Cognitive: Oriented  Insight: Developing/Improving  Engagement in Therapy: Engaged  Modes of Intervention: Discussion, Education and Socialization  Summary of Progress/Problems: Mental Health Association (MHA) Speaker came to talk about his personal journey with mental health. The pt processed ways by which to relate to the speaker. MHA speaker provided handouts and educational information pertaining to groups and services offered by the [facility]. Pt was engaged in speaker's presentation and was receptive to resources provided.    Pulte HomesHeather N Smart, LCSW 06/14/2017 2:15 PM

## 2017-06-14 NOTE — Progress Notes (Signed)
Recreation Therapy Notes  Patient Eligibility Criteria Checklist & Daily Group note for Rec TxIntervention  Date: 01.15.2019 Time: 2:45pm [address]   AAA/T Program Assumption of Risk Form signed by Patient/ or Parent Legal Guardian Yes  Patient is free of allergies or sever asthma Yes  Patient reports no fear of animals Yes  Patient reports no history of cruelty to animals Yes  Patient understands his/her participation is voluntary Yes  Behavioral Response: Did not attend.   Marykay Lexenise L Melat Wrisley, LRT/CTRS         Zamariyah Furukawa L 06/14/2017 3:01 PM

## 2017-06-14 NOTE — Progress Notes (Signed)
D  Pt is pleasant on approach and cooperative   He interacts well with staff and peers  Pt came to the medication window requesting all the medications he could have at that time because he had all the symptoms         A   Verbal support given   Medications administered and effectiveness monitored    Q 15 min checks   Discussed medications and thearaputic use with patient  R   Pt is safe at present time

## 2017-06-14 NOTE — Progress Notes (Signed)
Patient ID: Mathew CivilDaniel H Bailey, male   DOB: 11/30/1990, 27 y.o.   MRN: [id number] D) Pt isolative, irritable this morning, c/o general malaise and stomach cramping. After receiving PRN medications for said c/o but has been active in the milieu. Pt mood and affect have brighten throughout shift. Pt stating that he's happy about going to rehab program. A) Level 3 obs for safety, support and encouragement provided. Med ed reinforced. R) Cooperative.

## 2017-06-14 NOTE — Progress Notes (Signed)
Adult Psychoeducational Group Note  Date:  06/14/2017 Time:  12:12 AM  Group Topic/Focus:  Wrap-Up Group:   The focus of this group is to help patients review their daily goal of treatment and discuss progress on daily workbooks.  Participation Level:  Active  Participation Quality:  Appropriate  Affect:  Appropriate  Cognitive:  Appropriate  Insight: Appropriate  Engagement in Group:  Engaged  Modes of Intervention:  Discussion  Additional Comments:  Pt stated his goal for today was to talk with doctor and social worker about his discharge and aftercare. Pt stated he was able to accomplished his goal today. Pt rated his over all day and 8 out of 10. Pt stated he attend all groups held today. Felipa FurnaceChristopher  Nalin Mazzocco 06/14/2017, 12:12 AM

## 2017-06-15 DIAGNOSIS — F1120 Opioid dependence, uncomplicated: Secondary | ICD-10-CM

## 2017-06-15 DIAGNOSIS — F1910 Other psychoactive substance abuse, uncomplicated: Secondary | ICD-10-CM

## 2017-06-15 DIAGNOSIS — Z915 Personal history of self-harm: Secondary | ICD-10-CM

## 2017-06-15 DIAGNOSIS — Z653 Problems related to other legal circumstances: Secondary | ICD-10-CM

## 2017-06-15 DIAGNOSIS — Z599 Problem related to housing and economic circumstances, unspecified: Secondary | ICD-10-CM

## 2017-06-15 LAB — CBC
HCT: 45.3 % (ref 39.0–52.0)
Hemoglobin: 15.4 g/dL (ref 13.0–17.0)
MCH: 30.1 pg (ref 26.0–34.0)
MCHC: 34 g/dL (ref 30.0–36.0)
MCV: 88.6 fL (ref 78.0–100.0)
PLATELETS: 188 10*3/uL (ref 150–400)
RBC: 5.11 MIL/uL (ref 4.22–5.81)
RDW: 14.1 % (ref 11.5–15.5)
WBC: 8.8 10*3/uL (ref 4.0–10.5)

## 2017-06-15 LAB — COMPREHENSIVE METABOLIC PANEL
ALK PHOS: 87 U/L (ref 38–126)
ALT: 141 U/L — AB (ref 17–63)
AST: 55 U/L — AB (ref 15–41)
Albumin: 4.7 g/dL (ref 3.5–5.0)
Anion gap: 10 (ref 5–15)
BILIRUBIN TOTAL: 1 mg/dL (ref 0.3–1.2)
BUN: 28 mg/dL — AB (ref 6–20)
CO2: 24 mmol/L (ref 22–32)
CREATININE: 1.15 mg/dL (ref 0.61–1.24)
Calcium: 9.8 mg/dL (ref 8.9–10.3)
Chloride: 103 mmol/L (ref 101–111)
GFR calc Af Amer: >60 mL/min (ref >60)
Glucose, Bld: 145 mg/dL — ABNORMAL HIGH (ref 65–99)
Potassium: 4.3 mmol/L (ref 3.5–5.1)
Sodium: 137 mmol/L (ref 135–145)
TOTAL PROTEIN: 8.7 g/dL — AB (ref 6.5–8.1)

## 2017-06-15 MED ORDER — PRAZOSIN HCL 1 MG PO CAPS: 1.0000 mg | ORAL_CAPSULE | Freq: Every day | ORAL | 0 refills | Status: AC

## 2017-06-15 MED ORDER — SULFAMETHOXAZOLE-TRIMETHOPRIM 800-160 MG PO TABS: 1.0000 | ORAL_TABLET | Freq: Two times a day (BID) | ORAL | 0 refills | Status: DC

## 2017-06-15 MED ORDER — PRAZOSIN HCL 1 MG PO CAPS
1.0000 mg | ORAL_CAPSULE | Freq: Every day | ORAL | Status: DC
  Filled 2017-06-15: qty 1
  Filled 2017-06-15: qty 21

## 2017-06-15 MED ORDER — TRAZODONE HCL 100 MG PO TABS: 100.0000 mg | ORAL_TABLET | Freq: Every evening | ORAL | 0 refills | Status: AC | PRN

## 2017-06-15 MED ORDER — ALBUTEROL SULFATE HFA 108 (90 BASE) MCG/ACT IN AERS: 2.0000 | INHALATION_SPRAY | RESPIRATORY_TRACT | 0 refills | Status: DC | PRN

## 2017-06-15 NOTE — Progress Notes (Signed)
Recreation Therapy Notes  Date: 06/15/17 Time: 0930 Location: [address] Dayroom  Group Topic: Stress Management  Goal Area(s) Addresses:  Patient will verbalize importance of using healthy stress management.  Patient will identify positive emotions associated with healthy stress management.   Intervention: Stress Management  Activity :  Meditation.  LRT introduced the stress management group of meditation.  LRT played a meditation from the Calm app to participate in the meditation.  Patients were to listen and follow along with the meditation in order to engage in the activity.  Education:  Stress Management, Discharge Planning.   Education Outcome: Acknowledges edcuation/In group clarification offered/Needs additional education  Clinical Observations/Feedback: Pt did not attend group.    Caroll RancherMarjette Moyinoluwa Dawe, LRT/CTRS         Caroll RancherLindsay, Teresha Hanks A 06/15/2017 12:38 PM

## 2017-06-15 NOTE — Progress Notes (Signed)
  [facility] :  Will you be returning to the same living situation after discharge:  No. [facility] for [facility], 06/15/17 at 9:30PM.  At discharge, do you have transportation home?: Yes,  Pt states that [facility] will pick him up at 9:30PM tonight. Do you have the ability to pay for your medications: Yes,  [employer] insurance  Release of information consent forms completed and submitted to medical records by CSW.  Patient to Follow up at: Follow-up Information    [facility] Follow up on 06/15/2017.   Specialty:  Addiction Medicine Why:  [facility]. You will be provided with a 21 day supply of medications. Thank you.  Contact information: [address] [place] [address] [phone]           Next level of care provider [facility]:no  Safety Planning and Suicide Prevention discussed: Yes,  SPE completed with pt's father. SPI pamphlet and Mobile crisis information provided to pt.   Have you used any form of tobacco in the last 30 days? (Cigarettes, Smokeless Tobacco, Cigars, and/or Pipes): Yes  Has patient been referred to the Quitline?: Patient refused referral  Patient has been referred for addiction treatment: Yes  Pulte HomesHeather N Smart, LCSW 06/15/2017, 12:48 PM

## 2017-06-15 NOTE — Progress Notes (Signed)
D: Patient states he is "nauseated and I threw up this morning."  He is observed lying in bed.  He was given zofran with good results.  He got up and is in the day room interacting with his peers.  Patient called [facility] this morning and was upset that he didn't get an answer.  He has found out, however, he has a bed and will be leaving by 930 tonight.  He denies any thoughts of self harm.  He is eager go to long term treatment.  A: Continue to monitor medication management and MD orders.  Safety checks continued every 15 minutes per protocol.  Offer support and encouragement as needed.  R: Patient is receptive to staff; his behavior is appropriate.

## 2017-06-15 NOTE — Progress Notes (Signed)
Adult Psychoeducational Group Note  Date:  06/15/2017 Time:  2:12 AM  Group Topic/Focus:  Wrap-Up Group:   The focus of this group is to help patients review their daily goal of treatment and discuss progress on daily workbooks.  Participation Level:  Active  Participation Quality:  Appropriate  Affect:  Appropriate  Cognitive:  Appropriate  Insight: Appropriate  Engagement in Group:  Engaged  Modes of Intervention:  Discussion  Additional Comments:  Pt stated his goal for today was to talk with his social worker about his aftercare. Pt stated he was able to accomplished his goal today. Pt rated his day a 7 out of 10. Pt stated he attend all groups held today.  Mathew FurnaceChristopher  Azarya Bailey 06/15/2017, 2:12 AM
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
## 2017-06-15 NOTE — Progress Notes (Signed)
Pt was discharged    Left with all belongings and follow up information   He also received sample medications and prescriptions    [facility]   Pt denies suicidal and homicidal ideation    Mood affect and behavior are appropriate and consistent with pt stated baseline function

## 2017-06-15 NOTE — [facility] Group Notes (Signed)
Daily Orientation  Date:  06/15/2017  Time:  8:41 AM  Type of Therapy:  Nurse Education  /  The group focuses on orienting patients to the daily programming schedule, explaining unit flow and establishing therapeutic relationships.   Participation Level:  Did Not Attend  Participation Quality:    Affect:    Cognitive:    Insight:    Engagement in Group:    Modes of Intervention:    Summary of Progress/Problems:  Mathew Bailey, Mathew Bailey 06/15/2017, 8:41 AM

## 2017-08-26 ENCOUNTER — Other Ambulatory Visit: Payer: Self-pay

## 2017-08-26 ENCOUNTER — Emergency Department (HOSPITAL_COMMUNITY)
Admission: EM | Admit: 2017-08-26 | Discharge: 2017-08-26 | Disposition: A | Discharge status: Home / Self Care | Payer: BLUE CROSS/BLUE SHIELD | Attending: Emergency Medicine | Admitting: Emergency Medicine

## 2017-08-26 ENCOUNTER — Encounter (HOSPITAL_COMMUNITY): Payer: Self-pay | Admitting: Emergency Medicine

## 2017-08-26 DIAGNOSIS — Z79899 Other long term (current) drug therapy: Secondary | ICD-10-CM | POA: Insufficient documentation

## 2017-08-26 DIAGNOSIS — F17210 Nicotine dependence, cigarettes, uncomplicated: Secondary | ICD-10-CM | POA: Insufficient documentation

## 2017-08-26 DIAGNOSIS — F329 Major depressive disorder, single episode, unspecified: Secondary | ICD-10-CM | POA: Insufficient documentation

## 2017-08-26 DIAGNOSIS — Z91040 Latex allergy status: Secondary | ICD-10-CM | POA: Insufficient documentation

## 2017-08-26 DIAGNOSIS — F1220 Cannabis dependence, uncomplicated: Secondary | ICD-10-CM | POA: Insufficient documentation

## 2017-08-26 DIAGNOSIS — F909 Attention-deficit hyperactivity disorder, unspecified type: Secondary | ICD-10-CM | POA: Insufficient documentation

## 2017-08-26 DIAGNOSIS — J4541 Moderate persistent asthma with (acute) exacerbation: Secondary | ICD-10-CM | POA: Insufficient documentation

## 2017-08-26 DIAGNOSIS — F1120 Opioid dependence, uncomplicated: Secondary | ICD-10-CM | POA: Insufficient documentation

## 2017-08-26 MED ORDER — ALBUTEROL SULFATE HFA 108 (90 BASE) MCG/ACT IN AERS
2.0000 | INHALATION_SPRAY | Freq: Once | RESPIRATORY_TRACT | Status: AC
  Administered 2017-08-26: 2 via RESPIRATORY_TRACT
  Filled 2017-08-26: qty 6.7

## 2017-08-26 MED ORDER — ALBUTEROL SULFATE (2.5 MG/3ML) 0.083% IN NEBU
5.0000 mg | INHALATION_SOLUTION | Freq: Once | RESPIRATORY_TRACT | Status: AC
  Administered 2017-08-26: 5 mg via RESPIRATORY_TRACT
  Filled 2017-08-26: qty 6

## 2017-08-26 MED ORDER — ALBUTEROL SULFATE HFA 108 (90 BASE) MCG/ACT IN AERS: 1.0000 | INHALATION_SPRAY | Freq: Four times a day (QID) | RESPIRATORY_TRACT | 0 refills | Status: AC | PRN

## 2017-08-26 MED ORDER — DEXAMETHASONE 4 MG PO TABS
8.0000 mg | ORAL_TABLET | Freq: Once | ORAL | Status: AC
  Administered 2017-08-26: 8 mg via ORAL
  Filled 2017-08-26: qty 2

## 2017-08-26 NOTE — ED Triage Notes (Signed)
Pt states he woke up wheezing and feeling SOB, reports he got out of rehab about 3 mos ago and has had increased episodes of wheezing and SOB since drug use stopped, pt states he does not have an inhaler to use

## 2017-08-26 NOTE — ED Notes (Signed)
Pt currently sleeping at this time.

## 2017-08-26 NOTE — ED Provider Notes (Signed)
[facility] EMERGENCY DEPARTMENT Provider Note   CSN: [id number] Arrival date & time: 08/26/17  0302     History   Chief Complaint Chief Complaint  Patient presents with  . Asthma    HPI Mathew Bailey is a 27 y.o. male.  The history is provided by the patient.  Asthma  This is a new problem. The current episode started yesterday. The problem occurs hourly. The problem has been gradually worsening. Associated symptoms include chest pain and shortness of breath. Nothing aggravates the symptoms. Relieved by: albuterol.  Patient with history of asthma, previous drug abuse presents with asthma attack. Reports over the past days been having increasing cough, wheezing and shortness of breath.  He reports mild chest pain/tightness.  No fever.  No hemoptysis.  He reports it became so bad he called EMS.  He was given nebulized treatments in route and started to feel improved He is currently a smoker. Denies history of intubation Past Medical History:  Diagnosis Date  . ADHD (attention deficit hyperactivity disorder)   . Anemia   . Asthma   . Cellulitis   . GERD (gastroesophageal reflux disease)   . Thrombocytopenia (HCC)   . Tobacco use     Patient Active Problem List   Diagnosis Date Noted  . Major depressive disorder, recurrent severe without psychotic features (HCC) 06/11/2017  . Benzodiazepine dependence (HCC)   . Cannabis use disorder, severe, dependence (HCC) 06/23/2016  . Severe episode of recurrent major depressive disorder, without psychotic features (HCC)   . Opioid use disorder, severe, dependence (HCC) 07/28/2015  . Cellulitis of foot, right 02/13/2014  . R Plantar Puncture wound 02/13/2014  . Tobacco abuse 02/13/2014  . Cellulitis 02/13/2014  . Thrombocytopenia, unspecified (HCC) 01/22/2013    History reviewed. No pertinent surgical history.      Home Medications    Prior to Admission medications   Medication Sig Start Date End Date Taking? Authorizing  Provider  prazosin (MINIPRESS) 1 MG capsule Take 1 capsule (1 mg total) by mouth at bedtime. For nightmares 06/15/17   Money, [name], FNP  traZODone (DESYREL) 100 MG tablet Take 1 tablet (100 mg total) by mouth at bedtime as needed and may repeat dose one time if needed for sleep. 06/15/17   Money, [name], FNP    Family History No family history on file.  Social History Social History   Tobacco Use  . Smoking status: Current Every Day Smoker    Packs/day: 0.50    Years: 10.00    Pack years: 5.00    Types: Cigarettes  . Smokeless tobacco: Never Used  Substance Use Topics  . Alcohol use: No  . Drug use: Not Currently    Comment: none in past 3 mos since rehab     Allergies   Nickel; Other; and Latex   Review of Systems Review of Systems  Constitutional: Negative for fever.  Respiratory: Positive for shortness of breath.   Cardiovascular: Positive for chest pain.  All other systems reviewed and are negative.    Physical Exam Updated Vital Signs BP 133/86 (BP Location: Right Arm)   Pulse 95   Temp 98.2 F (36.8 C) (Oral)   Resp 16   Ht 1.803 m (5\' 11" )   Wt 68 kg (150 lb)   SpO2 95%   BMI 20.92 kg/m   Physical Exam  CONSTITUTIONAL: Well developed/well nourished, sleeping on arrival HEAD: Normocephalic/atraumatic EYES: Keeps eyes closed during exam ENMT: Mucous membranes moist NECK: supple no  meningeal signs SPINE/BACK:entire spine nontender CV: S1/S2 noted, no murmurs/rubs/gallops noted LUNGS: Scattered wheezing bilaterally, no distress noted ABDOMEN: soft, nontender NEURO: Pt is awake/alert/appropriate, moves all extremitiesx4.  No facial droop.   EXTREMITIES: pulses normal/equal, full ROM, no lower extremity edema SKIN: warm, color normal PSYCH: no abnormalities of mood noted, alert and oriented to situation  ED Treatments / Results  Labs (all labs ordered are listed, but only abnormal results are displayed) Labs Reviewed - No data to  display  EKG None  Radiology No results found.  Procedures Procedures   Medications Ordered in ED Medications  albuterol (PROVENTIL) (2.5 MG/3ML) 0.083% nebulizer solution 5 mg (5 mg Nebulization Given 08/26/17 0435)  albuterol (PROVENTIL HFA;VENTOLIN HFA) 108 (90 Base) MCG/ACT inhaler 2 puff (2 puffs Inhalation Given 08/26/17 0435)  dexamethasone (DECADRON) tablet 8 mg (8 mg Oral Given 08/26/17 0429)     Initial Impression / Assessment and Plan / ED Course  I have reviewed the triage vital signs and the nursing notes.      4:38 AM Patient already improved by the time of my evaluation.  We will give one-time dose of albuterol/decadron, then discharged home.  He would benefit from outpatient management due to multiple episodes of asthma recently. 6:04 AM Patient improved, no distress, resting comfortably.  I feel he is appropriate for discharge.  Advised need to establish with PCP. Final Clinical Impressions(s) / ED Diagnoses   Final diagnoses:  Moderate persistent asthma with exacerbation    ED Discharge Orders        Ordered    albuterol (PROVENTIL HFA;VENTOLIN HFA) 108 (90 Base) MCG/ACT inhaler  Every 6 hours PRN     08/26/17 0525       Zadie RhineWickline, Myrtis Maille, MD 08/26/17 [phone]

## 2017-08-28 ENCOUNTER — Encounter (HOSPITAL_COMMUNITY): Payer: Self-pay | Admitting: Emergency Medicine

## 2017-08-28 ENCOUNTER — Emergency Department (HOSPITAL_COMMUNITY)
Admission: EM | Admit: 2017-08-28 | Discharge: 2017-08-29 | Disposition: E | Payer: Self-pay | Attending: Emergency Medicine | Admitting: Emergency Medicine

## 2017-08-28 DIAGNOSIS — I469 Cardiac arrest, cause unspecified: Secondary | ICD-10-CM | POA: Insufficient documentation

## 2017-08-28 DIAGNOSIS — F17210 Nicotine dependence, cigarettes, uncomplicated: Secondary | ICD-10-CM | POA: Insufficient documentation

## 2017-08-28 DIAGNOSIS — Z79899 Other long term (current) drug therapy: Secondary | ICD-10-CM | POA: Insufficient documentation

## 2017-08-28 DIAGNOSIS — J45909 Unspecified asthma, uncomplicated: Secondary | ICD-10-CM | POA: Insufficient documentation

## 2017-08-28 DIAGNOSIS — T401X4A Poisoning by heroin, undetermined, initial encounter: Secondary | ICD-10-CM | POA: Insufficient documentation

## 2017-08-28 DIAGNOSIS — F1920 Other psychoactive substance dependence, uncomplicated: Secondary | ICD-10-CM | POA: Insufficient documentation

## 2017-08-28 DIAGNOSIS — R0602 Shortness of breath: Secondary | ICD-10-CM | POA: Insufficient documentation

## 2017-08-28 MED ORDER — EPINEPHRINE PF 1 MG/10ML IJ SOSY
PREFILLED_SYRINGE | INTRAMUSCULAR | Status: AC | PRN
  Administered 2017-08-28 (×4): 1 via INTRAVENOUS

## 2017-08-28 MED ORDER — SODIUM BICARBONATE 8.4 % IV SOLN
INTRAVENOUS | Status: AC | PRN
  Administered 2017-08-28: 50 meq via INTRAVENOUS

## 2017-08-28 MED ORDER — NALOXONE HCL 2 MG/2ML IJ SOSY
PREFILLED_SYRINGE | INTRAMUSCULAR | Status: AC | PRN
  Administered 2017-08-28: 2 mg via INTRAVENOUS

## 2017-08-29 MED FILL — Medication: Qty: 1 | Status: AC

## 2017-08-29 NOTE — Code Documentation (Addendum)
Patient time of death occurred at [id number], pronounced by Dr Jodi MourningZavitz

## 2017-08-29 NOTE — ED Provider Notes (Signed)
[facility] EMERGENCY DEPARTMENT Provider Note   CSN: [id number] Arrival date & time: September 18, 2017  1607     History   Chief Complaint Chief Complaint  Patient presents with  . Drug Overdose    HPI Mathew Bailey is a 27 y.o. male.  The history is provided by the EMS personnel.  Cardiac Arrest  Witnessed by:  Friend Incident location: at cook out. Time since incident:  1 hour Time before BLS initiated:  Immediate Time before ALS initiated:  3-5 minutes Condition upon EMS arrival:  Unresponsive and apneic Pulse:  Absent Initial cardiac rhythm per EMS:  PEA Treatments prior to arrival:  ACLS protocol, vascular access and intubation Medications given prior to ED:  Epinephrine IV access type:  Peripheral Airway:  Cricothyrotomy (failed endotracheal intubation and king airway so cricothyrotomy performed ) Rhythm on admission to ED:  Asystole Associated symptoms: shortness of breath   Risk factors: drug overdose     Past Medical History:  Diagnosis Date  . ADHD (attention deficit hyperactivity disorder)   . Anemia   . Asthma   . Cellulitis   . GERD (gastroesophageal reflux disease)   . Thrombocytopenia (HCC)   . Tobacco use     Patient Active Problem List   Diagnosis Date Noted  . Major depressive disorder, recurrent severe without psychotic features (HCC) 06/11/2017  . Benzodiazepine dependence (HCC)   . Cannabis use disorder, severe, dependence (HCC) 06/23/2016  . Severe episode of recurrent major depressive disorder, without psychotic features (HCC)   . Opioid use disorder, severe, dependence (HCC) 07/28/2015  . Cellulitis of foot, right 02/13/2014  . R Plantar Puncture wound 02/13/2014  . Tobacco abuse 02/13/2014  . Cellulitis 02/13/2014  . Thrombocytopenia, unspecified (HCC) 01/22/2013    History reviewed. No pertinent surgical history.      Home Medications    Prior to Admission medications   Medication Sig Start Date End Date  Taking? Authorizing Provider  albuterol (PROVENTIL HFA;VENTOLIN HFA) 108 (90 Base) MCG/ACT inhaler Inhale 1-2 puffs into the lungs every 6 (six) hours as needed for wheezing or shortness of breath. 08/26/17   Zadie Rhine, MD  prazosin (MINIPRESS) 1 MG capsule Take 1 capsule (1 mg total) by mouth at bedtime. For nightmares 06/15/17   Money, [name], FNP  traZODone (DESYREL) 100 MG tablet Take 1 tablet (100 mg total) by mouth at bedtime as needed and may repeat dose one time if needed for sleep. 06/15/17   Money, [name], FNP    Family History No family history on file.  Social History Social History   Tobacco Use  . Smoking status: Current Every Day Smoker    Packs/day: 0.50    Years: 10.00    Pack years: 5.00    Types: Cigarettes  . Smokeless tobacco: Never Used  Substance Use Topics  . Alcohol use: No  . Drug use: Not Currently    Comment: none in past 3 mos since rehab     Allergies   Nickel; Other; and Latex   Review of Systems Review of Systems  Unable to perform ROS: Acuity of condition  Respiratory: Positive for shortness of breath.   Level 5 caveat: Cardiac arrest   Physical Exam Updated Vital Signs Pulse (!) 0 Comment: ventialted assisted PTA  Temp (!) 95.3 F (35.2 C) (Tympanic)   Resp (!) 0   Ht 5\' 11"  (1.803 m)   Wt 68 kg (150 lb)   SpO2 (!) 55%   BMI  20.92 kg/m   Physical Exam  Constitutional: He appears well-developed and well-nourished. He appears toxic.  Cricothyrotomy placed prior to arrival.  HENT:  Head: Normocephalic and atraumatic.  Eyes:  Conjunctival injection bilaterally.  Pupils 9 mm bilaterally and nonreactive.  Neck: Neck supple.  Cardiovascular:  Pulseless.  Asystole on the monitor during all pulse checks.  Pulmonary/Chest: He has decreased breath sounds in the right upper field. He has rhonchi.  No spontaneous respirations   Abdominal: Soft. There is no tenderness.  Musculoskeletal: He exhibits no edema.  Neurological: He  is unresponsive. He displays no seizure activity. GCS eye subscore is 1. GCS verbal subscore is 1. GCS motor subscore is 1.  Skin: Skin is dry.  Nursing note and vitals reviewed.    ED Treatments / Results  Labs (all labs ordered are listed, but only abnormal results are displayed) Labs Reviewed - No data to display  EKG None  Radiology No results found.  Procedures Procedure Name: Intubation Date/Time: 15-Sep-2017 4:15 PM Performed by: Dwana Melena, DO Pre-anesthesia Checklist: Emergency Drugs available, Suction available and Patient being monitored Laryngoscope Size: Glidescope Grade View: Grade II Airway Equipment and Method: Video-laryngoscopy Difficulty Due To: Difficulty was anticipated Comments: Epiglottis and vallecula was noted to be significant to the right the expected position.  EMS had performed a cricothyroidotomy due to difficult airway prior to arrival.  Placement was confirmed with video laryngoscopy.  This did not need to be exchanged given proper placement.      (including critical care time)  Medications Ordered in ED Medications  EPINEPHrine (ADRENALIN) 1 MG/10ML injection (1 Syringe Intravenous Given 09/15/2017 1619)  sodium bicarbonate injection (50 mEq Intravenous Given 2017-09-15 1613)  naloxone (NARCAN) injection (2 mg Intravenous Given September 15, 2017 1619)     Initial Impression / Assessment and Plan / ED Course  I have reviewed the triage vital signs and the nursing notes.  Pertinent labs & imaging results that were available during my care of the patient were reviewed by me and considered in my medical decision making (see chart for details).     Patient is a 27 year old male with history of depression, polysubstance abuse who presents in cardiac arrest.  Patient initially had a PEA rhythm but quickly degenerated into asystole.  Patient had return of spontaneous circulation briefly twice with EMS.  Patient received multiple rounds of CPR and epinephrine  prior to arrival.  Patient reportedly was at a cookout and was stating he felt short of breath and then he collapsed.  Patient received CPR from bystander firefighters before EMS arrival.  Patient has received CPR for about 40 minutes prior to arrival here.  Patient also received about 6 mg of Narcan prior to arrival.  On exam patient is pulseless, has a cricothyrotomy in place with a Shiley trach.  Placement was confirmed with a glide scope under direct visualization.  Patient had breath sounds on the left but had softer breath sounds on the right likely from aspiration.  Patient was noted to have large volumes of vomitus with EMS and in the oropharynx.  Patient's friends reported to EMS that he did do heroin earlier today.  Patient given 3 more rounds of epinephrine, sodium bicarbonate.  Patient remained in asystole on all pulse checks and never had return of spontaneous circulation.  Resuscitation efforts were terminated due to no apparent reversible causes and prolonged downtime.  Time of death is called 1625.  Medical examiner was notified.  Spoke with the patient's family and gave them  my condolences.  The father stated that he has had a long-standing problem with heroin abuse and drug abuse.  Final Clinical Impressions(s) / ED Diagnoses   Final diagnoses:  Diacetylmorphine overdose, undetermined intent, initial encounter [facility](HCC)  Cardiac arrest [facility](HCC)    ED Discharge Orders    None       Dwana MelenaDong, Chakia Counts, DO 08/29/17 [id number]    Blane OharaZavitz, Joshua, MD 08/29/17 0126

## 2017-08-29 NOTE — Code Documentation (Signed)
No pulse present.

## 2017-08-29 NOTE — Progress Notes (Addendum)
Chaplain responded to page for this patient who has died.  No family currently present.  Will remain available as needed.    April 21, 2018 1636  Clinical Encounter Type  Visited With Patient not available;Health care provider  Visit Type Initial;Death;ED;Trauma    Chaplain was called when family arrived.  Family very upset at the situation around this death.  Today is the mother and father's anniversary.  They are distraught and wondering what else they could have done.  Mother said to me that he went to rehab and did very well but his life has been a living hell.  Mother also states that she did everything except sale her house to help him.  Family asked for prayer.  Family given placement card.

## 2017-08-29 NOTE — Code Documentation (Signed)
No pulse 

## 2017-08-29 NOTE — Code Documentation (Signed)
BP 0/0

## 2017-08-29 NOTE — Code Documentation (Signed)
CPR in progress, pt was complaining of SOB prior to heroin use, 3 failed attempted at intubation due to swollen airway, pt was cric'd, pulses back off and on per EMS, capnography was 15, pt was down for 40 mins, 4 2ml of narcan given prior to EMS arrival, 500 ml normal saline, 2 more 2mL narcan, 4 rounds of 10 mL epi given by EMS. Pt asystole on lucas, GCS 3

## 2017-08-29 DEATH — deceased

## 2019-12-27 IMAGING — DX DG HAND COMPLETE 3+V*L*
3 series · 3 of 3 positions shown · non-contrast
Comparison: Left hand radiograph dated 07/21/2015

CLINICAL DATA: 26-year-old male with trauma to the left hand.

EXAM:
LEFT HAND - COMPLETE 3+ VIEW

[hand ap]
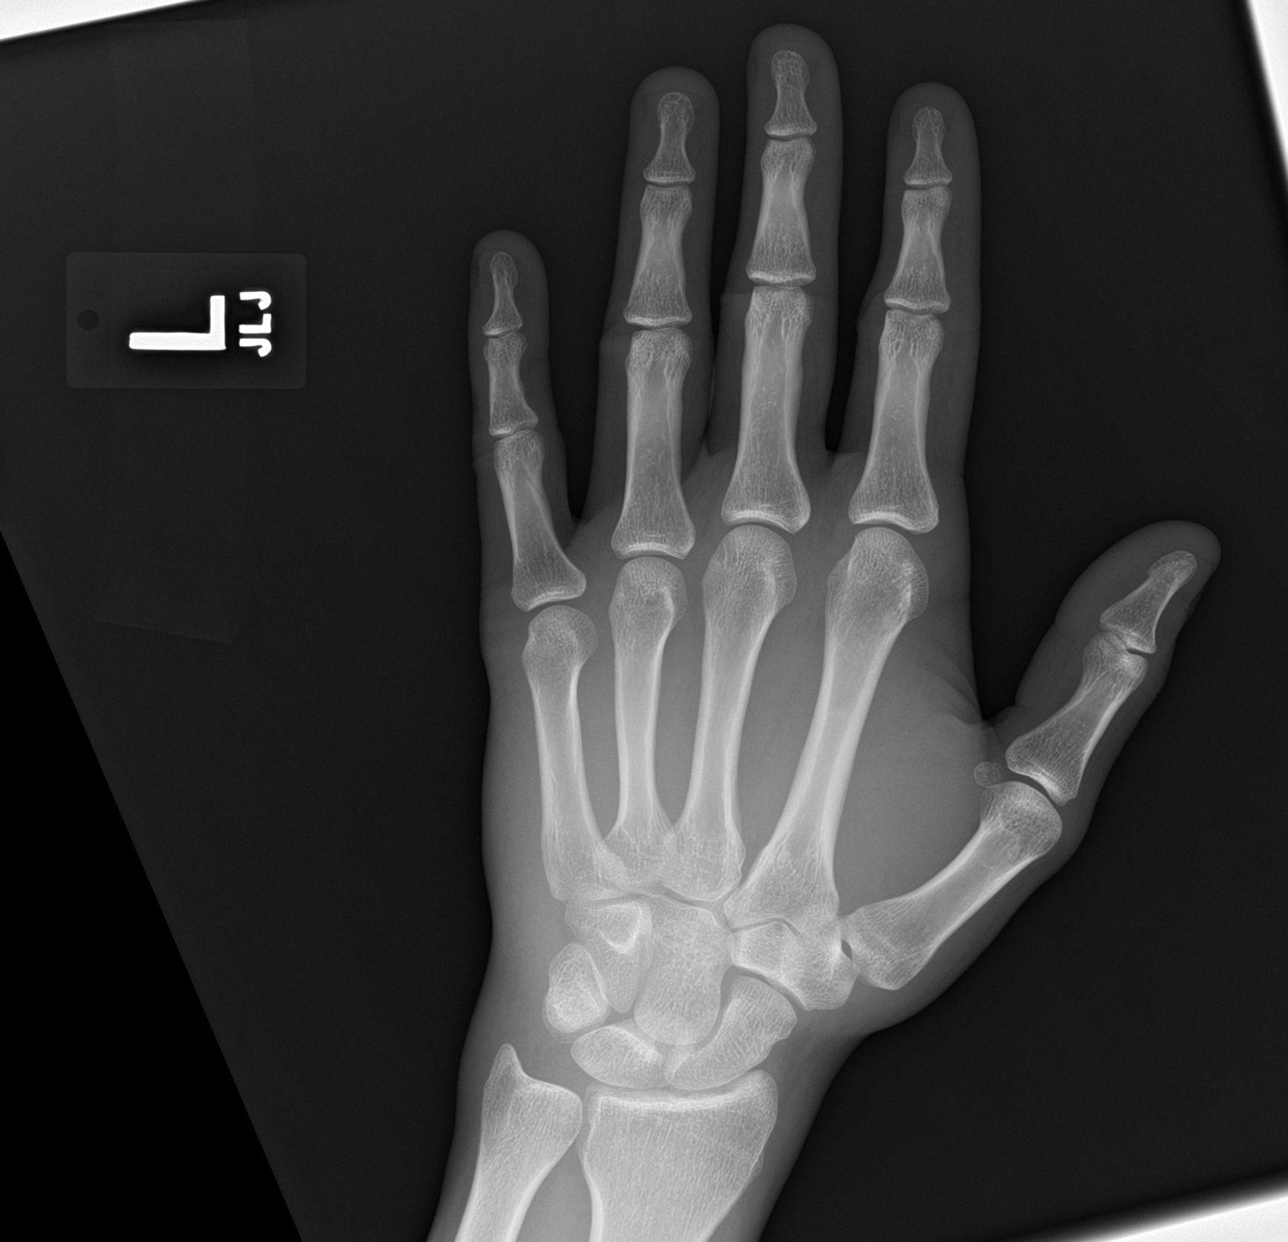

[hand obl]
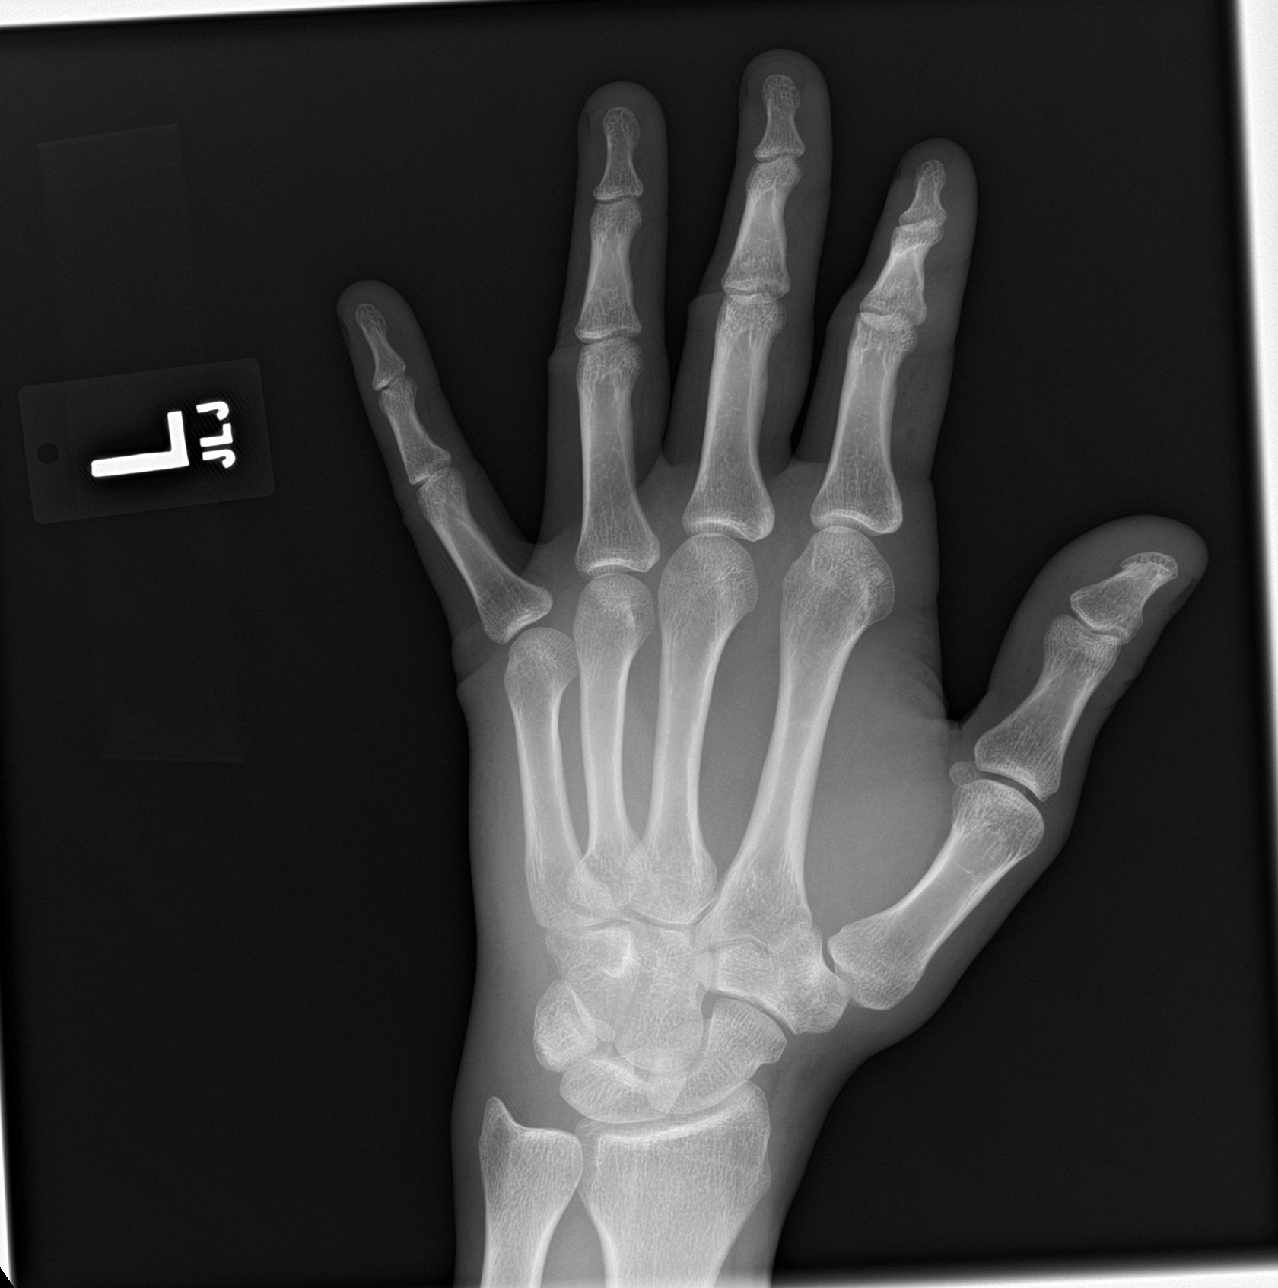

[hand lat]
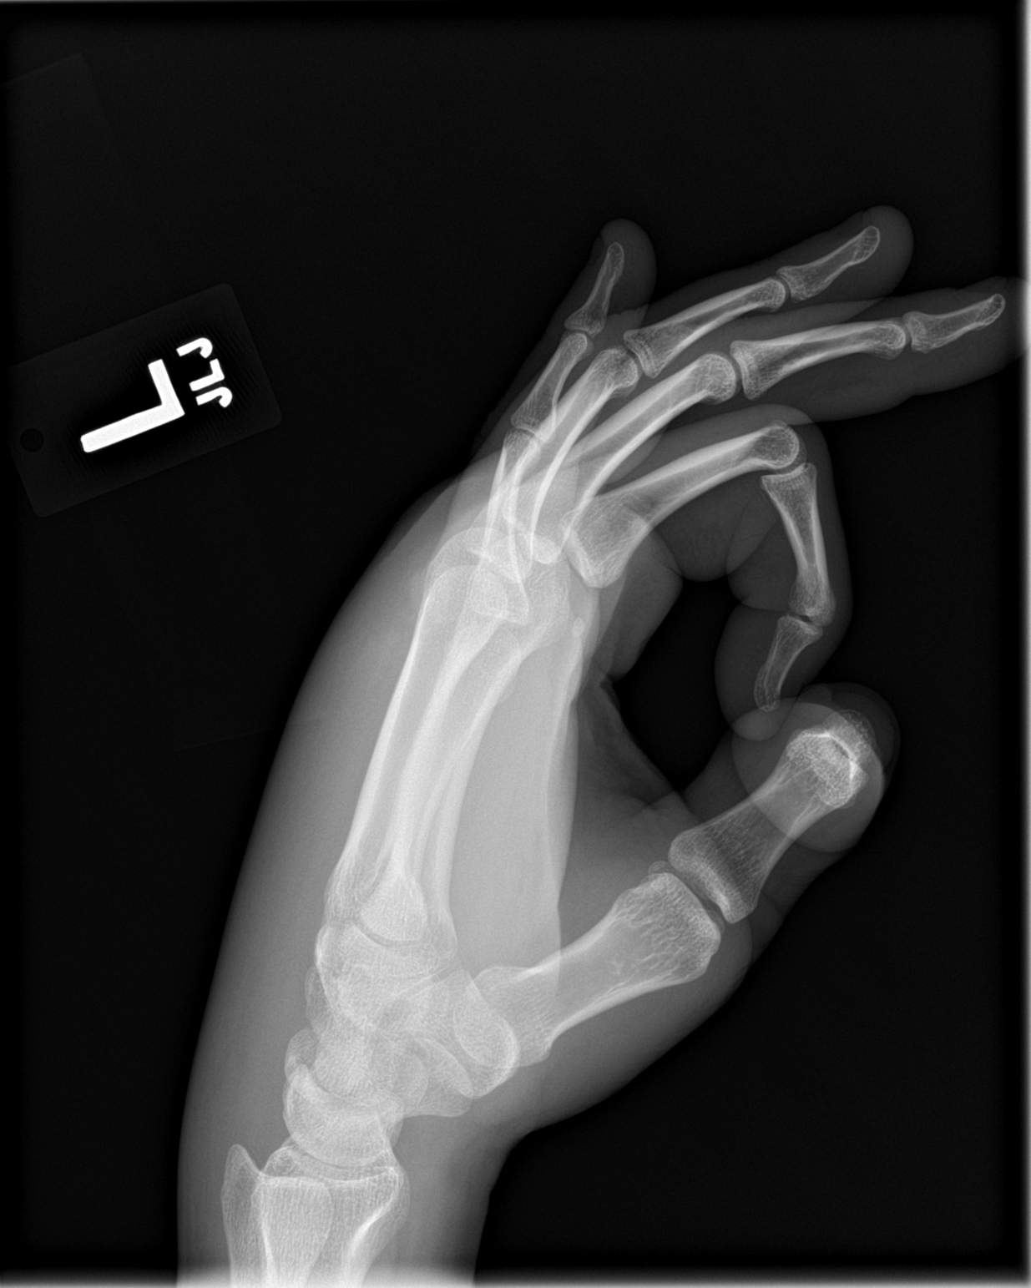

[3 of 3 positions shown; findings below may reference images not displayed]

FINDINGS: There is no acute fracture or dislocation. The bones are well
mineralized. No arthritic changes. There is diffuse soft tissue
swelling primarily over the dorsum of the hand and wrist. No
radiopaque foreign object or soft tissue gas.
IMPRESSION: 1. No acute fracture or dislocation.
2. Soft tissue swelling.

## 2019-12-27 IMAGING — CR DG CHEST 2V
2 series · 2 of 2 positions shown · non-contrast
Comparison: 12/11/2016

CLINICAL DATA: Shortness of breath, wheezing

EXAM:
CHEST  2 VIEW

[w chest pa]
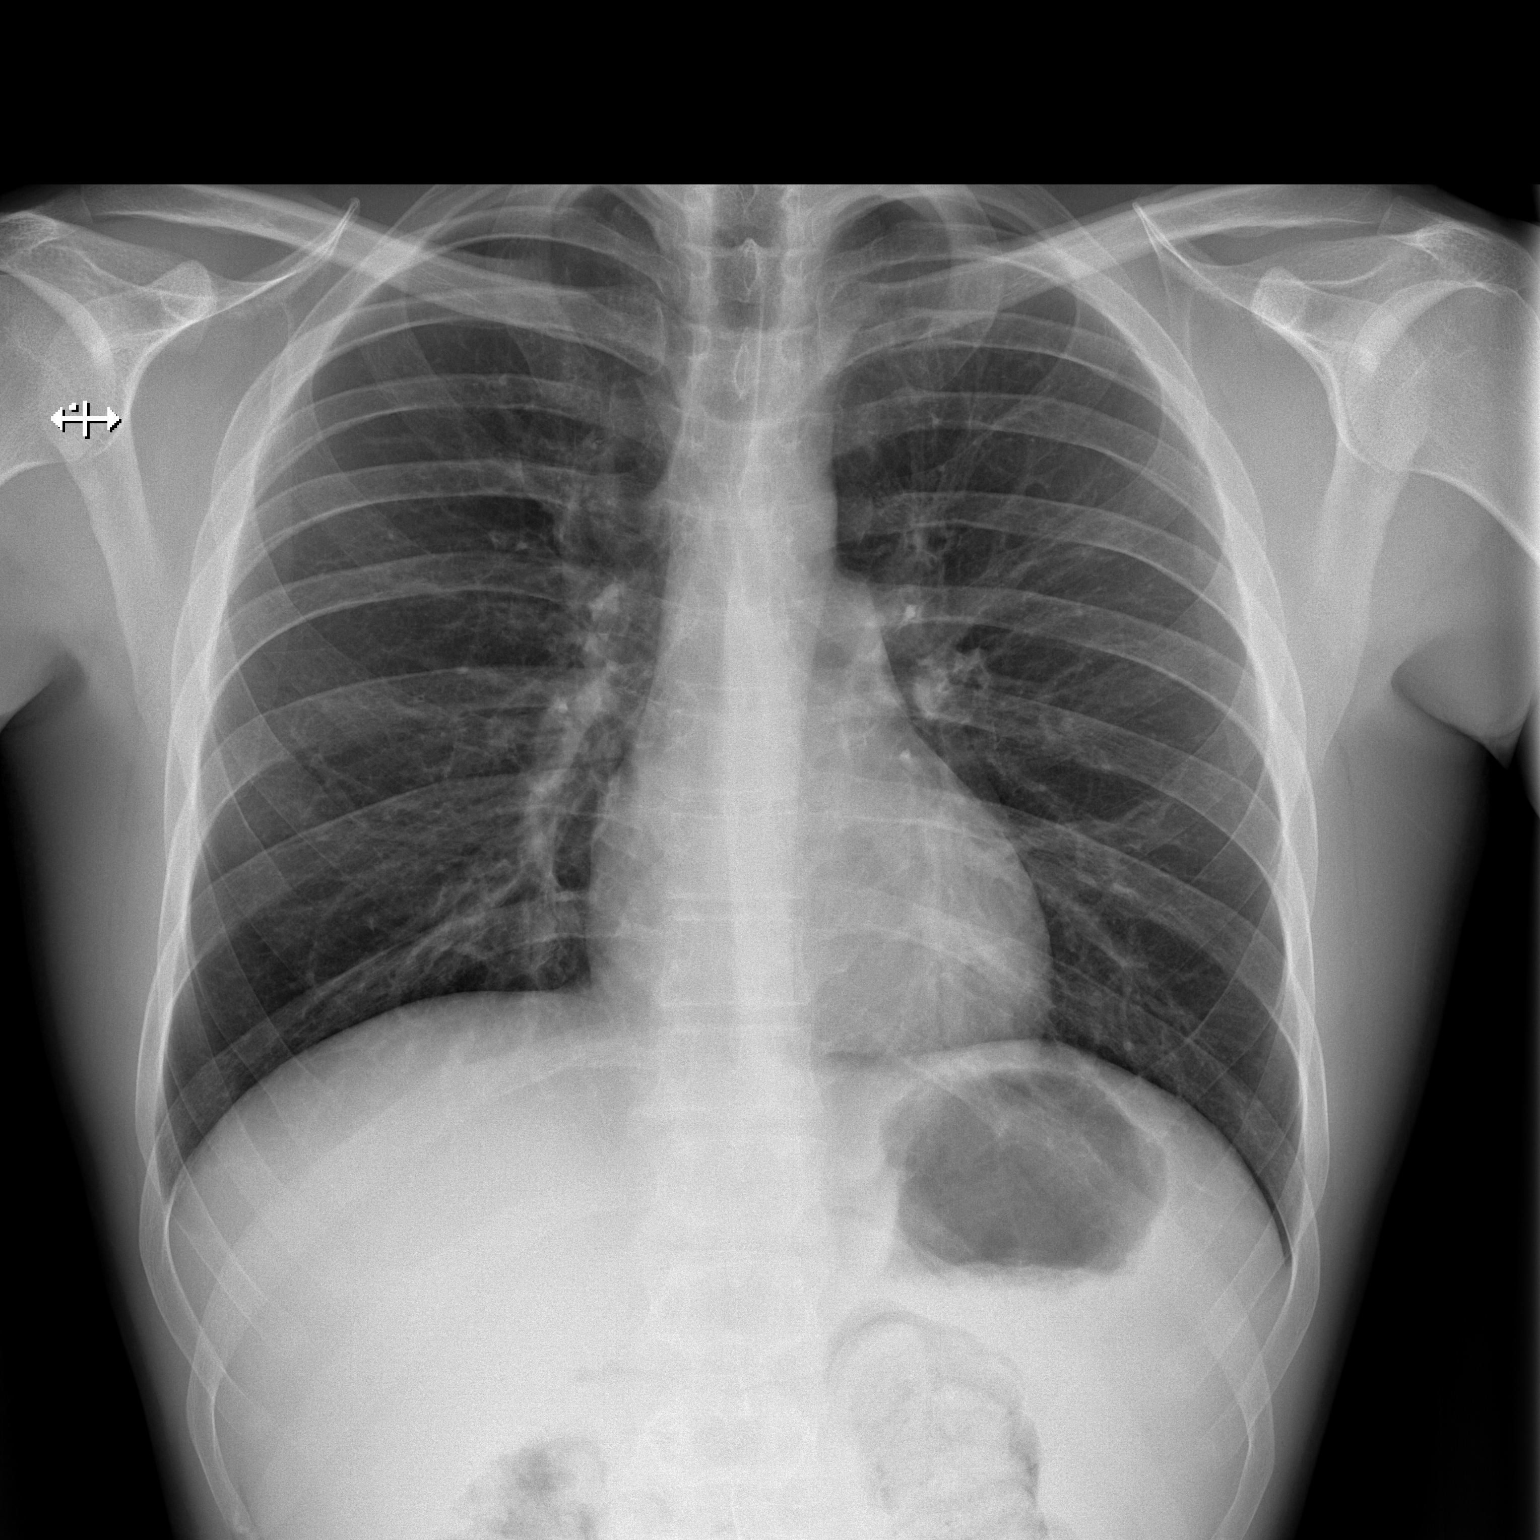

[w chest lat]
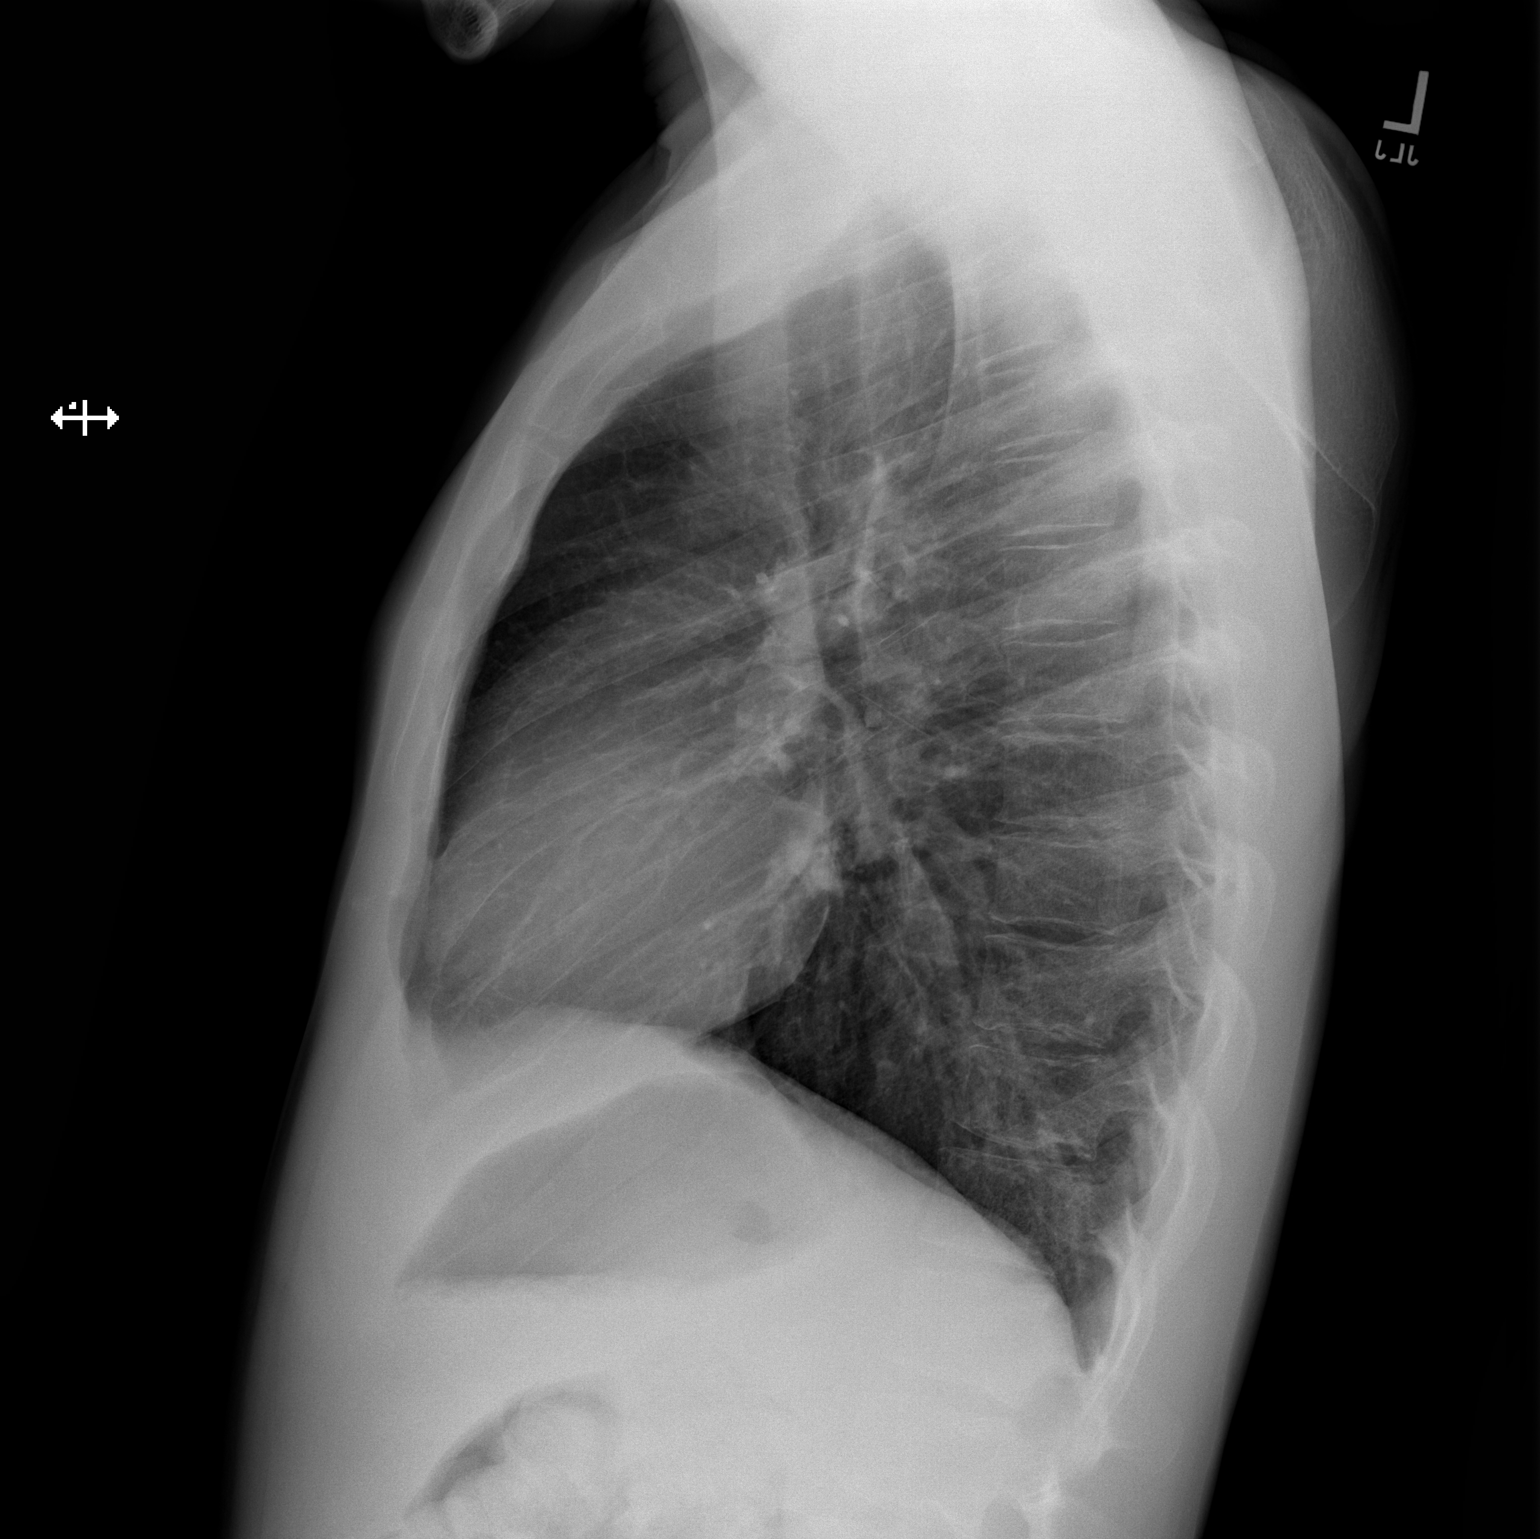

[2 of 2 positions shown; findings below may reference images not displayed]

FINDINGS: Heart and mediastinal contours are within normal limits. No focal
opacities or effusions. No acute bony abnormality.
IMPRESSION: No active cardiopulmonary disease.
